# Patient Record
Sex: Female | Born: 1960
Health system: Southern US, Community
[De-identification: ages and names within clinical notes are randomized; demographics above are authoritative.]

## PROBLEM LIST (undated history)

## (undated) DIAGNOSIS — F419 Anxiety disorder, unspecified: Secondary | ICD-10-CM

## (undated) DIAGNOSIS — R1310 Dysphagia, unspecified: Secondary | ICD-10-CM

## (undated) DIAGNOSIS — K589 Irritable bowel syndrome without diarrhea: Secondary | ICD-10-CM

## (undated) DIAGNOSIS — Z973 Presence of spectacles and contact lenses: Secondary | ICD-10-CM

## (undated) DIAGNOSIS — E669 Obesity, unspecified: Secondary | ICD-10-CM

## (undated) DIAGNOSIS — R011 Cardiac murmur, unspecified: Secondary | ICD-10-CM

## (undated) DIAGNOSIS — B029 Zoster without complications: Secondary | ICD-10-CM

## (undated) DIAGNOSIS — J301 Allergic rhinitis due to pollen: Secondary | ICD-10-CM

## (undated) DIAGNOSIS — M109 Gout, unspecified: Secondary | ICD-10-CM

## (undated) DIAGNOSIS — M545 Low back pain, unspecified: Secondary | ICD-10-CM

## (undated) DIAGNOSIS — N898 Other specified noninflammatory disorders of vagina: Secondary | ICD-10-CM

## (undated) DIAGNOSIS — N950 Postmenopausal bleeding: Secondary | ICD-10-CM

## (undated) DIAGNOSIS — R519 Headache, unspecified: Secondary | ICD-10-CM

## (undated) DIAGNOSIS — I10 Essential (primary) hypertension: Secondary | ICD-10-CM

## (undated) DIAGNOSIS — Z8719 Personal history of other diseases of the digestive system: Secondary | ICD-10-CM

## (undated) DIAGNOSIS — K219 Gastro-esophageal reflux disease without esophagitis: Secondary | ICD-10-CM

## (undated) DIAGNOSIS — M549 Dorsalgia, unspecified: Secondary | ICD-10-CM

## (undated) DIAGNOSIS — R112 Nausea with vomiting, unspecified: Secondary | ICD-10-CM

## (undated) DIAGNOSIS — B351 Tinea unguium: Secondary | ICD-10-CM

## (undated) DIAGNOSIS — G8929 Other chronic pain: Secondary | ICD-10-CM

## (undated) DIAGNOSIS — M1990 Unspecified osteoarthritis, unspecified site: Secondary | ICD-10-CM

## (undated) DIAGNOSIS — R238 Other skin changes: Secondary | ICD-10-CM

## (undated) DIAGNOSIS — B379 Candidiasis, unspecified: Secondary | ICD-10-CM

## (undated) DIAGNOSIS — Z9889 Other specified postprocedural states: Secondary | ICD-10-CM

## (undated) DIAGNOSIS — J302 Other seasonal allergic rhinitis: Secondary | ICD-10-CM

## (undated) HISTORY — DX: Obesity, unspecified: E66.9

## (undated) HISTORY — DX: Low back pain, unspecified: M54.50

## (undated) HISTORY — DX: Other specified noninflammatory disorders of vagina: N89.8

## (undated) HISTORY — DX: Zoster without complications: B02.9

## (undated) HISTORY — PX: CHOLECYSTECTOMY: SHX55

## (undated) HISTORY — PX: OTHER SURGICAL HISTORY: SHX169

## (undated) HISTORY — DX: Allergic rhinitis due to pollen: J30.1

## (undated) HISTORY — DX: Essential (primary) hypertension: I10

## (undated) HISTORY — DX: Candidiasis, unspecified: B37.9

---
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
## 1999-02-12 ENCOUNTER — Other Ambulatory Visit: Admission: RE | Admit: 1999-02-12 | Discharge: 1999-02-12 | Payer: Self-pay | Admitting: Gynecology

## 2000-02-01 ENCOUNTER — Other Ambulatory Visit: Admission: RE | Admit: 2000-02-01 | Discharge: 2000-02-01 | Payer: Self-pay | Admitting: Gynecology

## 2001-05-30 ENCOUNTER — Other Ambulatory Visit: Admission: RE | Admit: 2001-05-30 | Discharge: 2001-05-30 | Payer: Self-pay | Admitting: Obstetrics and Gynecology

## 2002-07-16 ENCOUNTER — Other Ambulatory Visit: Admission: RE | Admit: 2002-07-16 | Discharge: 2002-07-16 | Payer: Self-pay | Admitting: Obstetrics and Gynecology

## 2003-07-30 ENCOUNTER — Ambulatory Visit (HOSPITAL_COMMUNITY): Admission: RE | Admit: 2003-07-30 | Discharge: 2003-07-30 | Payer: Self-pay | Admitting: Pulmonary Disease

## 2003-10-07 ENCOUNTER — Other Ambulatory Visit: Admission: RE | Admit: 2003-10-07 | Discharge: 2003-10-07 | Payer: Self-pay | Admitting: Obstetrics and Gynecology

## 2004-10-25 ENCOUNTER — Other Ambulatory Visit: Admission: RE | Admit: 2004-10-25 | Discharge: 2004-10-25 | Payer: Self-pay | Admitting: Obstetrics and Gynecology

## 2004-10-26 ENCOUNTER — Ambulatory Visit (HOSPITAL_COMMUNITY): Admission: RE | Admit: 2004-10-26 | Discharge: 2004-10-26 | Payer: Self-pay | Admitting: Obstetrics and Gynecology

## 2004-11-09 LAB — HM MAMMOGRAPHY

## 2005-08-17 ENCOUNTER — Encounter: Admission: RE | Admit: 2005-08-17 | Discharge: 2005-08-17 | Payer: Self-pay | Admitting: Orthopedic Surgery

## 2010-06-06 ENCOUNTER — Encounter: Payer: Self-pay | Admitting: Orthopedic Surgery

## 2011-04-04 ENCOUNTER — Other Ambulatory Visit (INDEPENDENT_AMBULATORY_CARE_PROVIDER_SITE_OTHER): Payer: Self-pay | Admitting: *Deleted

## 2011-04-04 DIAGNOSIS — Z1211 Encounter for screening for malignant neoplasm of colon: Secondary | ICD-10-CM

## 2011-04-29 ENCOUNTER — Ambulatory Visit: Admit: 2011-04-29 | Payer: Self-pay | Admitting: Internal Medicine

## 2011-04-29 SURGERY — COLONOSCOPY
Anesthesia: Moderate Sedation

## 2012-04-02 ENCOUNTER — Encounter (INDEPENDENT_AMBULATORY_CARE_PROVIDER_SITE_OTHER): Payer: Self-pay | Admitting: *Deleted

## 2012-04-03 ENCOUNTER — Other Ambulatory Visit (INDEPENDENT_AMBULATORY_CARE_PROVIDER_SITE_OTHER): Payer: Self-pay | Admitting: *Deleted

## 2012-04-03 DIAGNOSIS — Z1211 Encounter for screening for malignant neoplasm of colon: Secondary | ICD-10-CM

## 2012-04-03 DIAGNOSIS — R1310 Dysphagia, unspecified: Secondary | ICD-10-CM

## 2012-04-04 ENCOUNTER — Telehealth (INDEPENDENT_AMBULATORY_CARE_PROVIDER_SITE_OTHER): Payer: Self-pay | Admitting: *Deleted

## 2012-04-04 NOTE — Telephone Encounter (Signed)
  Procedure: tcs/egd/ed  Reason/Indication:  Screening, dysphagia  Has patient had this procedure before?  no  If so, when, by whom and where?    Is there a family history of colon cancer?  no  Who?  What age when diagnosed?    Is patient diabetic?   no      Does patient have prosthetic heart valve?  no  Do you have a pacemaker?  no  Has patient had joint replacement within last 12 months?  no  Is patient on Coumadin, Plavix and/or Aspirin? no  Medications: micardis/hct 80/25 mg daily, vit b complex w/ c daily, vit e 400 mg daily, vit d 1000 iu daily, hydrocodone 5/500 prn, cyclobenzaprine 5 mg prn, dexilant 60 mg prn  Allergies: pcn  Medication Adjustment:   Procedure date & time: 05/04/12 at 730  tcs

## 2012-04-04 NOTE — Telephone Encounter (Signed)
agree

## 2012-04-25 ENCOUNTER — Encounter (HOSPITAL_COMMUNITY): Payer: Self-pay | Admitting: Pharmacy Technician

## 2012-05-04 ENCOUNTER — Ambulatory Visit (HOSPITAL_COMMUNITY)
Admission: RE | Admit: 2012-05-04 | Discharge: 2012-05-04 | Disposition: A | Discharge status: Home / Self Care | Payer: 59 | Source: Ambulatory Visit | Attending: Internal Medicine | Admitting: Internal Medicine

## 2012-05-04 ENCOUNTER — Encounter (HOSPITAL_COMMUNITY): Payer: Self-pay | Admitting: *Deleted

## 2012-05-04 ENCOUNTER — Encounter (HOSPITAL_COMMUNITY)
Admission: RE | Disposition: A | Discharge status: Home / Self Care | Payer: Self-pay | Source: Ambulatory Visit | Attending: Internal Medicine

## 2012-05-04 DIAGNOSIS — D126 Benign neoplasm of colon, unspecified: Secondary | ICD-10-CM | POA: Insufficient documentation

## 2012-05-04 DIAGNOSIS — K319 Disease of stomach and duodenum, unspecified: Secondary | ICD-10-CM

## 2012-05-04 DIAGNOSIS — Z1211 Encounter for screening for malignant neoplasm of colon: Secondary | ICD-10-CM

## 2012-05-04 DIAGNOSIS — K5730 Diverticulosis of large intestine without perforation or abscess without bleeding: Secondary | ICD-10-CM | POA: Insufficient documentation

## 2012-05-04 DIAGNOSIS — I10 Essential (primary) hypertension: Secondary | ICD-10-CM | POA: Insufficient documentation

## 2012-05-04 DIAGNOSIS — K208 Other esophagitis without bleeding: Secondary | ICD-10-CM | POA: Insufficient documentation

## 2012-05-04 DIAGNOSIS — K2960 Other gastritis without bleeding: Secondary | ICD-10-CM | POA: Insufficient documentation

## 2012-05-04 DIAGNOSIS — K222 Esophageal obstruction: Secondary | ICD-10-CM

## 2012-05-04 DIAGNOSIS — K219 Gastro-esophageal reflux disease without esophagitis: Secondary | ICD-10-CM | POA: Insufficient documentation

## 2012-05-04 DIAGNOSIS — K2970 Gastritis, unspecified, without bleeding: Secondary | ICD-10-CM

## 2012-05-04 DIAGNOSIS — R1310 Dysphagia, unspecified: Secondary | ICD-10-CM

## 2012-05-04 HISTORY — DX: Nausea with vomiting, unspecified: R11.2

## 2012-05-04 HISTORY — DX: Gastro-esophageal reflux disease without esophagitis: K21.9

## 2012-05-04 HISTORY — PX: SAVORY DILATION: SHX5439

## 2012-05-04 HISTORY — DX: Other chronic pain: G89.29

## 2012-05-04 HISTORY — DX: Essential (primary) hypertension: I10

## 2012-05-04 HISTORY — DX: Other seasonal allergic rhinitis: J30.2

## 2012-05-04 HISTORY — DX: Dorsalgia, unspecified: M54.9

## 2012-05-04 HISTORY — DX: Other specified postprocedural states: Z98.890

## 2012-05-04 HISTORY — PX: COLONOSCOPY WITH ESOPHAGOGASTRODUODENOSCOPY (EGD): SHX5779

## 2012-05-04 HISTORY — DX: Dysphagia, unspecified: R13.10

## 2012-05-04 HISTORY — PX: MALONEY DILATION: SHX5535

## 2012-05-04 HISTORY — PX: BALLOON DILATION: SHX5330

## 2012-05-04 SURGERY — COLONOSCOPY WITH ESOPHAGOGASTRODUODENOSCOPY (EGD)
Anesthesia: Moderate Sedation

## 2012-05-04 MED ORDER — DEXLANSOPRAZOLE 60 MG PO CPDR: 60.0000 mg | DELAYED_RELEASE_CAPSULE | Freq: Every day | ORAL | Status: DC

## 2012-05-04 MED ORDER — STERILE WATER FOR IRRIGATION IR SOLN
Status: DC | PRN
  Administered 2012-05-04: 08:00:00

## 2012-05-04 MED ORDER — MEPERIDINE HCL 50 MG/ML IJ SOLN
INTRAMUSCULAR | Status: DC | PRN
  Administered 2012-05-04 (×4): 25 mg via INTRAVENOUS

## 2012-05-04 MED ORDER — MEPERIDINE HCL 50 MG/ML IJ SOLN
INTRAMUSCULAR | Status: AC
  Filled 2012-05-04: qty 1

## 2012-05-04 MED ORDER — SODIUM CHLORIDE 0.45 % IV SOLN
INTRAVENOUS | Status: DC
  Administered 2012-05-04: 08:00:00 via INTRAVENOUS

## 2012-05-04 MED ORDER — MIDAZOLAM HCL 5 MG/5ML IJ SOLN
INTRAMUSCULAR | Status: DC | PRN
  Administered 2012-05-04: 3 mg via INTRAVENOUS
  Administered 2012-05-04 (×2): 2 mg via INTRAVENOUS
  Administered 2012-05-04: 3 mg via INTRAVENOUS
  Administered 2012-05-04 (×2): 2 mg via INTRAVENOUS
  Administered 2012-05-04: 1 mg via INTRAVENOUS

## 2012-05-04 MED ORDER — MIDAZOLAM HCL 5 MG/5ML IJ SOLN
INTRAMUSCULAR | Status: AC
  Filled 2012-05-04: qty 10

## 2012-05-04 MED ORDER — MIDAZOLAM HCL 5 MG/5ML IJ SOLN
INTRAMUSCULAR | Status: AC
  Filled 2012-05-04: qty 5

## 2012-05-04 NOTE — Op Note (Signed)
EGD PROCEDURE REPORT  PATIENT:  Renee Alvarez  MR#:  [id number] Birthdate:  [birth year], 51 y.o., female Endoscopist:  Dr. Malissa Hippo, MD Referred By:  Dr. Oneal Deputy. Juanetta Gosling, MD Procedure Date: 05/04/2012  Procedure:   EGD, ED & Colonoscopy.  Indications:  Patient is 51 year old Caucasian female who presents with intermittent solid food dysphagia. She also has GERD and takes PPI on when necessary basis. She is also undergoing average risk screening colonoscopy.            Informed Consent:  The risks, benefits, alternatives & imponderables which include, but are not limited to, bleeding, infection, perforation, drug reaction and potential missed lesion have been reviewed.  The potential for biopsy, lesion removal, esophageal dilation, etc. have also been discussed.  Questions have been answered.  All parties agreeable.  Please see history & physical in medical record for more information.  Medications:  Demerol 100 mg IV Versed 15 mg IV Cetacaine spray topically for oropharyngeal anesthesia  EGD  Description of procedure:  The endoscope was introduced through the mouth and advanced to the second portion of the duodenum without difficulty or limitations. The mucosal surfaces were surveyed very carefully during advancement of the scope and upon withdrawal.  Findings:  Esophagus:  Mucosa of the proximal and middle and middle third was normal. Few erosions noted within the distal 3 cm along with scar and erosion at GE junction along with noncritical ring. GEJ:  36 cm Hiatus:  38 cm Stomach:  Stomach was empty and distended very well with insufflation. Folds in the proximal stomach were normal. Examination mucosa at body revealed  few hyperplastic-appearing polyps. These were left alone. Scattered erosions noted at antrum along with 6-7 mm submucosal lesion suspicious for small leiomyoma. Pyloric channel was patent. Angularis fundus and cardia were examined by retroflexing the scope and were  normal. Duodenum:  Normal bulbar and post bulbar mucosa.  Therapeutic/Diagnostic Maneuvers Performed:  Esophagus dilated by passing 54 French Maloney dilator resulting and linear mucosal disruption just below the UES and distally at GE junction.  COLONOSCOPY Description of procedure:  After a digital rectal exam was performed, that colonoscope was advanced from the anus through the rectum and colon to the area of the cecum, ileocecal valve and appendiceal orifice. The cecum was deeply intubated. These structures were well-seen and photographed for the record. From the level of the cecum and ileocecal valve, the scope was slowly and cautiously withdrawn. The mucosal surfaces were carefully surveyed utilizing scope tip to flexion to facilitate fold flattening as needed. The scope was pulled down into the rectum where a thorough exam including retroflexion was performed.  Findings:   Prep excellent. Few small diverticula at sigmoid colon. Small polyp ablated via cold biopsy from mid sigmoid colon. Normal rectal mucosa and anorectal junction.  Therapeutic/Diagnostic Maneuvers Performed:  see above  Complications:  None  Cecal Withdrawal Time:  12 minutes  Impression:  Erosive esophagitis with the ring at GE junction. Few small hyperplastic polyps at gastric body which are left alone. Erosive antral gastritis. 6 to 7 mm submucosal lesion at antrum suspicious for small leiomyoma. 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  Colonoscopy completed to cecum. Few small diverticula at sigmoid colon. Small polyp ablated via cold biopsy from the sigmoid colon.   Recommendations:  Continue anti-reflux measures. Take Dexilant  60 mg by mouth every  morning. H. pylori serology. I will contact patient with results of biopsy and blood test results.  Johnsie Moscoso U   05/04/2012 8:45 AM  CC: Dr. Fredirick Maudlin, MD & Dr. Bonnetta Barry ref. provider found

## 2012-05-04 NOTE — H&P (Addendum)
Renee Alvarez is an 51 y.o. female.   Chief Complaint: Patient is here for EGD, ED and colonoscopy. HPI: Patient is 51 year old Caucasian female who presents with several month history of intermittent solid food dysphagia. She also has intermittent heartburn and uses PPI on command. She points to her suprasternal area side abortus obstruction. She has a good appetite denies weight loss. She is also undergoing  screening colonoscopy. There is no history of rectal bleeding or change in her bowel habits. Family history is negative for colorectal carcinoma.  Past Medical History  Diagnosis Date  . GERD (gastroesophageal reflux disease)   . Dysphagia   . Chronic back pain   . Seasonal allergies   . Hypertension   . PONV (postoperative nausea and vomiting)     Past Surgical History  Procedure Date  . Cesarean section 1997  . Expoloratory lap   . Cyst removed from right finger   . Cholecystectomy     Family History  Problem Relation Age of Onset  . Colon cancer Neg Hx    Social History:  reports that she has never smoked. She does not have any smokeless tobacco history on file. She reports that she does not drink alcohol or use illicit drugs.  Allergies:  Allergies  Allergen Reactions  . Penicillins Rash    Medications Prior to Admission  Medication Sig Dispense Refill  . B Complex-C (B-COMPLEX WITH VITAMIN C) tablet Take 1 tablet by mouth daily.      . cholecalciferol (VITAMIN D) 1000 UNITS tablet Take 1,000 Units by mouth daily.      . cyclobenzaprine (FLEXERIL) 5 MG tablet Take 5 mg by mouth 3 (three) times daily as needed. Back spasms.      [name] Kitchen dexlansoprazole (DEXILANT) 60 MG capsule Take 60 mg by mouth daily as needed. Heartburn/indigestion.      [name] Kitchen HYDROcodone-acetaminophen (VICODIN) 5-500 MG per tablet Take 1 tablet by mouth every 6 (six) hours as needed. Back pain.      [name] Kitchen telmisartan-hydrochlorothiazide (MICARDIS HCT) 80-25 MG per tablet Take 1 tablet by mouth daily.      .  vitamin E 400 UNIT capsule Take 400 Units by mouth daily.      [name] Kitchen levocetirizine (XYZAL) 5 MG tablet Take 5 mg by mouth daily as needed. Allergies.        No results found for this or any previous visit (from the past 48 hour(s)). No results found.  ROS  Blood pressure 134/85, pulse 100, temperature 97.9 F (36.6 C), temperature source Oral, resp. rate 24, height 5\' 8"  (1.727 m), weight 230 lb (104.327 kg), SpO2 99.00%. Physical Exam  Constitutional: She appears well-developed and well-nourished.  HENT:  Mouth/Throat: Oropharynx is clear and moist.  Eyes: Conjunctivae normal are normal. No scleral icterus.  Neck: No thyromegaly present.  Cardiovascular: Normal rate, regular rhythm and normal heart sounds.   Murmur: short systolic murmur at LLSB. Respiratory: Effort normal and breath sounds normal.  GI: Soft. She exhibits no distension and no mass. There is no tenderness.  Musculoskeletal: She exhibits no edema.  Lymphadenopathy:    She has no cervical adenopathy.  Neurological: She is alert.  Skin: Skin is warm and dry.     Assessment/Plan Solid food dysphagia. GERD. EGD with ED and average risk screening colonoscopy.  REHMAN,NAJEEB U 05/04/2012, 7:39 AM

## 2012-05-07 LAB — H. PYLORI ANTIBODY, IGG: H Pylori IgG: <0.4 {ISR}

## 2012-05-10 ENCOUNTER — Encounter (INDEPENDENT_AMBULATORY_CARE_PROVIDER_SITE_OTHER): Payer: Self-pay | Admitting: *Deleted

## 2012-05-11 ENCOUNTER — Encounter (HOSPITAL_COMMUNITY): Payer: Self-pay | Admitting: Internal Medicine

## 2012-05-22 ENCOUNTER — Other Ambulatory Visit (INDEPENDENT_AMBULATORY_CARE_PROVIDER_SITE_OTHER): Payer: Self-pay | Admitting: Internal Medicine

## 2012-05-22 DIAGNOSIS — K219 Gastro-esophageal reflux disease without esophagitis: Secondary | ICD-10-CM

## 2012-05-22 MED ORDER — PANTOPRAZOLE SODIUM 40 MG PO TBEC: 40.0000 mg | DELAYED_RELEASE_TABLET | Freq: Every day | ORAL | Status: DC

## 2012-05-25 ENCOUNTER — Other Ambulatory Visit: Payer: Self-pay | Admitting: Obstetrics and Gynecology

## 2012-06-30 ENCOUNTER — Other Ambulatory Visit: Payer: Self-pay

## 2012-07-03 ENCOUNTER — Telehealth (INDEPENDENT_AMBULATORY_CARE_PROVIDER_SITE_OTHER): Payer: Self-pay | Admitting: Internal Medicine

## 2012-07-03 DIAGNOSIS — K219 Gastro-esophageal reflux disease without esophagitis: Secondary | ICD-10-CM

## 2012-07-03 MED ORDER — SUCRALFATE 1 GM/10ML PO SUSP: 1.0000 g | Freq: Four times a day (QID) | ORAL | Status: DC

## 2012-07-03 NOTE — Telephone Encounter (Signed)
Rx to [facility]

## 2013-01-05 ENCOUNTER — Emergency Department (HOSPITAL_COMMUNITY)
Admission: EM | Admit: 2013-01-05 | Discharge: 2013-01-05 | Disposition: A | Discharge status: Home / Self Care | Payer: 59 | Source: Home / Self Care

## 2013-01-05 ENCOUNTER — Encounter (HOSPITAL_COMMUNITY): Payer: Self-pay | Admitting: Emergency Medicine

## 2013-01-05 ENCOUNTER — Emergency Department (INDEPENDENT_AMBULATORY_CARE_PROVIDER_SITE_OTHER): Payer: 59

## 2013-01-05 DIAGNOSIS — M24873 Other specific joint derangements of unspecified ankle, not elsewhere classified: Secondary | ICD-10-CM

## 2013-01-05 DIAGNOSIS — M25372 Other instability, left ankle: Secondary | ICD-10-CM

## 2013-01-05 MED ORDER — MELOXICAM 15 MG PO TABS: 15.0000 mg | ORAL_TABLET | Freq: Every day | ORAL | Status: DC | PRN

## 2013-01-05 NOTE — ED Provider Notes (Signed)
Oliviarose AUBRII SHARPLESS is a 52 y.o. female who presents to Urgent Care today for left ankle swelling and mild pain. Started about one week ago after increased activity. Patient denies any injury. The pain and swelling are worse with activity and better with rest. She denies any significant pain at rest. She's tried some over-the-counter pain medications the only worked a bit. She feels well otherwise. No radiating pain weakness numbness.   PMH reviewed. Hypertension, and obesity History  Substance Use Topics  . Smoking status: Never Smoker   . Smokeless tobacco: Not on file  . Alcohol Use: No   ROS as above Medications reviewed. No current facility-administered medications for this encounter.   Current Outpatient Prescriptions  Medication Sig Dispense Refill  . B Complex-C (B-COMPLEX WITH VITAMIN C) tablet Take 1 tablet by mouth daily.      . cholecalciferol (VITAMIN D) 1000 UNITS tablet Take 1,000 Units by mouth daily.      [name] Kitchen telmisartan-hydrochlorothiazide (MICARDIS HCT) 80-25 MG per tablet Take 1 tablet by mouth daily.      . cyclobenzaprine (FLEXERIL) 5 MG tablet Take 5 mg by mouth 3 (three) times daily as needed. Back spasms.      [name] Kitchen HYDROcodone-acetaminophen (VICODIN) 5-500 MG per tablet Take 1 tablet by mouth every 6 (six) hours as needed. Back pain.      [name] Kitchen levocetirizine (XYZAL) 5 MG tablet Take 5 mg by mouth daily as needed. Allergies.      . meloxicam (MOBIC) 15 MG tablet Take 1 tablet (15 mg total) by mouth daily as needed for pain.  30 tablet  0  . pantoprazole (PROTONIX) 40 MG tablet Take 1 tablet (40 mg total) by mouth daily.  90 tablet  3  . sucralfate (CARAFATE) 1 GM/10ML suspension Take 10 mLs (1 g total) by mouth 4 (four) times daily.  420 mL  1  . vitamin E 400 UNIT capsule Take 400 Units by mouth daily.        Exam:  BP 133/76  Pulse 74  Temp(Src) 97.5 F (36.4 C) (Oral)  Resp 18  SpO2 99% Gen: Well NAD LEFT ANKLE: Mild effusion present. Otherwise normal-appearing.   Nontender, normal range of motion. Positive talar tilt and anterior drawer Capillary refill sensation and motion are intact  No results found for this or any previous visit (from the past 24 hour(s)). Dg Ankle Complete Left  01/05/2013   *RADIOLOGY REPORT*  Clinical Data: Ankle swelling.  No injury.  LEFT ANKLE COMPLETE - 3+ VIEW  Comparison: None.  Findings: No fracture or bone lesion.  The ankle mortise is normally spaced and aligned.  There is a small plantar calcaneal spur.  Diffuse soft tissue swelling is noted most evident medially.  IMPRESSION: No fracture or ankle joint abnormality.  Soft tissue swelling. Small plantar calcaneal spur.   Original Report Authenticated By: Amie Portland, M.D.   Limited musculoskeletal ultrasound of the left ankle.  Peroneal tendons are intact and in the groove and do. Excessive motion with ankle mobility.  The ATF ligament is visualized with increased laxity with inversion.  No effusion.   Assessment and Plan: 52 y.o. female with ankle instability with effusion.  Followup with Dr. Farris Has at [facility] for further evaluation and management.  Meloxicam and ASO ankle brace and home exercise program.  Discussed warning signs or symptoms. Please see discharge instructions. Patient expresses understanding.      Rodolph Bong, MD 01/05/13 1726

## 2013-01-05 NOTE — ED Notes (Signed)
Pt c/o left ankle pain/swelling onset 1 week Pain increases w/activity; swelling/pain goes down when she rests Denies: inj/trauma Alert w/no signs of acute distress.

## 2013-03-21 ENCOUNTER — Other Ambulatory Visit: Payer: Self-pay

## 2013-09-23 ENCOUNTER — Other Ambulatory Visit (INDEPENDENT_AMBULATORY_CARE_PROVIDER_SITE_OTHER): Payer: Self-pay | Admitting: Internal Medicine

## 2013-09-23 DIAGNOSIS — J329 Chronic sinusitis, unspecified: Secondary | ICD-10-CM

## 2013-09-23 NOTE — Telephone Encounter (Signed)
Rx sent 

## 2013-10-29 ENCOUNTER — Telehealth (INDEPENDENT_AMBULATORY_CARE_PROVIDER_SITE_OTHER): Payer: Self-pay | Admitting: Internal Medicine

## 2013-10-29 DIAGNOSIS — J329 Chronic sinusitis, unspecified: Secondary | ICD-10-CM

## 2013-10-29 MED ORDER — AZITHROMYCIN 250 MG PO TABS: ORAL_TABLET | ORAL | Status: DC

## 2013-10-29 NOTE — Telephone Encounter (Signed)
z PAK REORDERED.

## 2014-06-05 ENCOUNTER — Encounter: Payer: Self-pay | Admitting: Adult Health

## 2014-06-05 ENCOUNTER — Ambulatory Visit (INDEPENDENT_AMBULATORY_CARE_PROVIDER_SITE_OTHER): Payer: 59 | Admitting: Adult Health

## 2014-06-05 VITALS — BP 138/80 | Ht 67.0 in | Wt 265.0 lb

## 2014-06-05 DIAGNOSIS — N898 Other specified noninflammatory disorders of vagina: Secondary | ICD-10-CM

## 2014-06-05 DIAGNOSIS — B379 Candidiasis, unspecified: Secondary | ICD-10-CM

## 2014-06-05 MED ORDER — NYSTATIN-TRIAMCINOLONE 100000-0.1 UNIT/GM-% EX OINT: 1.0000 "application " | TOPICAL_OINTMENT | Freq: Two times a day (BID) | CUTANEOUS | Status: DC

## 2014-06-05 MED ORDER — FLUCONAZOLE 150 MG PO TABS: ORAL_TABLET | ORAL | Status: DC

## 2014-06-05 NOTE — Progress Notes (Signed)
Subjective:     Patient ID: Renee Alvarez, female   DOB: September 07, 1960, 54 y.o.   MRN: [id number]  HPI Renee Alvarez is a 54 year old white female in complaining of vaginal irritation and itching. used monistat Friday and then felt something in vagina and saw blood Saturday.  Review of Systems See HPI Reviewed past medical,surgical, social and family history. Reviewed medications and allergies.      Objective:   Physical Exam BP 138/80 mmHg  Ht 5\' 7"  (1.702 m)  Wt 265 lb (120.203 kg)  BMI 41.50 kg/m2   Skin warm and dry.Pelvic: external genitalia is red and swollen, no lesions vagina: white discharge without odor, cervix:smooth and bulbous, uterus: normal size, shape and contour, non tender, no masses felt, adnexa: no masses or tenderness noted. Wet prep: + for yeast and +WBCs.  Assessment:     Vaginal irritation Yeast     Plan:     Rx diflucan 150 mg #2 take 1 now and 1 in 3 days with 1 refill Rx mytrex cream use bid prn with 1 refill Use luvena for vaginal moisture and astroglide for sex Review handout on yeast

## 2014-06-05 NOTE — Patient Instructions (Signed)
Monilial Vaginitis Vaginitis in a soreness, swelling and redness (inflammation) of the vagina and vulva. Monilial vaginitis is not a sexually transmitted infection. CAUSES  Yeast vaginitis is caused by yeast (candida) that is normally found in your vagina. With a yeast infection, the candida has overgrown in number to a point that upsets the chemical balance. SYMPTOMS   White, thick vaginal discharge.  Swelling, itching, redness and irritation of the vagina and possibly the lips of the vagina (vulva).  Burning or painful urination.  Painful intercourse. DIAGNOSIS  Things that may contribute to monilial vaginitis are:  Postmenopausal and virginal states.  Pregnancy.  Infections.  Being tired, sick or stressed, especially if you had monilial vaginitis in the past.  Diabetes. Good control will help lower the chance.  Birth control pills.  Tight fitting garments.  Using bubble bath, feminine sprays, douches or deodorant tampons.  Taking certain medications that kill germs (antibiotics).  Sporadic recurrence can occur if you become ill. TREATMENT  Your caregiver will give you medication.  There are several kinds of anti monilial vaginal creams and suppositories specific for monilial vaginitis. For recurrent yeast infections, use a suppository or cream in the vagina 2 times a week, or as directed.  Anti-monilial or steroid cream for the itching or irritation of the vulva may also be used. Get your caregiver's permission.  Painting the vagina with methylene blue solution may help if the monilial cream does not work.  Eating yogurt may help prevent monilial vaginitis. HOME CARE INSTRUCTIONS   Finish all medication as prescribed.  Do not have sex until treatment is completed or after your caregiver tells you it is okay.  Take warm sitz baths.  Do not douche.  Do not use tampons, especially scented ones.  Wear cotton underwear.  Avoid tight pants and panty  hose.  Tell your sexual partner that you have a yeast infection. They should go to their caregiver if they have symptoms such as mild rash or itching.  Your sexual partner should be treated as well if your infection is difficult to eliminate.  Practice safer sex. Use condoms.  Some vaginal medications cause latex condoms to fail. Vaginal medications that harm condoms are:  Cleocin cream.  Butoconazole (Femstat).  Terconazole (Terazol) vaginal suppository.  Miconazole (Monistat) (may be purchased over the counter). SEEK MEDICAL CARE IF:   You have a temperature by mouth above 102 F (38.9 C).  The infection is getting worse after 2 days of treatment.  The infection is not getting better after 3 days of treatment.  You develop blisters in or around your vagina.  You develop vaginal bleeding, and it is not your menstrual period.  You have pain when you urinate.  You develop intestinal problems.  You have pain with sexual intercourse. Document Released: 02/09/2005 Document Revised: 07/25/2011 Document Reviewed: 10/24/2008 [facility] Patient Information 2015 [place], [place]. This information is not intended to replace advice given to you by your health care provider. Make sure you discuss any questions you have with your health care provider. Try luvena for vaginal moisture astroglide with sex

## 2014-08-19 ENCOUNTER — Telehealth (INDEPENDENT_AMBULATORY_CARE_PROVIDER_SITE_OTHER): Payer: Self-pay | Admitting: Internal Medicine

## 2014-08-19 NOTE — Telephone Encounter (Signed)
error 

## 2015-01-21 ENCOUNTER — Other Ambulatory Visit (INDEPENDENT_AMBULATORY_CARE_PROVIDER_SITE_OTHER): Payer: Self-pay | Admitting: Internal Medicine

## 2015-01-21 DIAGNOSIS — J0120 Acute ethmoidal sinusitis, unspecified: Secondary | ICD-10-CM

## 2015-01-21 MED ORDER — LEVOFLOXACIN 500 MG PO TABS: 500.0000 mg | ORAL_TABLET | Freq: Every day | ORAL | Status: DC

## 2015-02-13 ENCOUNTER — Ambulatory Visit (HOSPITAL_COMMUNITY): Payer: 59 | Attending: Sports Medicine

## 2015-02-13 DIAGNOSIS — M4000 Postural kyphosis, site unspecified: Secondary | ICD-10-CM | POA: Insufficient documentation

## 2015-02-13 DIAGNOSIS — M549 Dorsalgia, unspecified: Secondary | ICD-10-CM | POA: Insufficient documentation

## 2015-02-13 DIAGNOSIS — M25462 Effusion, left knee: Secondary | ICD-10-CM | POA: Diagnosis present

## 2015-02-13 DIAGNOSIS — M545 Low back pain, unspecified: Secondary | ICD-10-CM

## 2015-02-13 DIAGNOSIS — M6281 Muscle weakness (generalized): Secondary | ICD-10-CM | POA: Insufficient documentation

## 2015-02-13 DIAGNOSIS — R293 Abnormal posture: Secondary | ICD-10-CM | POA: Diagnosis present

## 2015-02-13 DIAGNOSIS — M25562 Pain in left knee: Secondary | ICD-10-CM | POA: Insufficient documentation

## 2015-02-13 DIAGNOSIS — R2681 Unsteadiness on feet: Secondary | ICD-10-CM | POA: Insufficient documentation

## 2015-02-13 NOTE — Patient Instructions (Signed)
Continue with seated LAQ as directed by orthopedist.  -Ice anterior knee 10-15 minute bouts to aid with management of swelling.

## 2015-02-13 NOTE — Therapy (Signed)
[place], [place], [address] Phone: [phone]   Fax:  [phone]  Physical Therapy Evaluation  Patient Details  Name: Renee Alvarez MRN: [id number] Date of Birth: [birth year] Referring Provider:  Verner Chol, MD  Encounter Date: 02/13/2015      PT End of Session - 02/13/15 1506    Visit Number 1   Number of Visits 18   Date for PT Re-Evaluation 04/10/15   Authorization Type [employer]   Authorization Time Period 02/13/15-04/10/15   Authorization - Visit Number 1   PT Start Time 1443   PT Stop Time 1540   PT Time Calculation (min) 60 min   Activity Tolerance Patient tolerated treatment well;No increased pain   Behavior During Therapy [facility] for tasks assessed/performed      Past Medical History  Diagnosis Date  . GERD (gastroesophageal reflux disease)   . Dysphagia   . Chronic back pain   . Seasonal allergies   . Hypertension   . PONV (postoperative nausea and vomiting)   . Vaginal irritation 06/05/2014  . Yeast infection 06/05/2014    Past Surgical History  Procedure Laterality Date  . Cesarean section  1997  . Expoloratory lap    . Cyst removed from right finger    . Cholecystectomy    . Colonoscopy with esophagogastroduodenoscopy (egd)  05/04/2012    Procedure: COLONOSCOPY WITH ESOPHAGOGASTRODUODENOSCOPY (EGD);  Surgeon: Rogene Houston, MD;  [facility];  Service: Endoscopy;  Laterality: N/A;  730  . Balloon dilation  05/04/2012    Procedure: BALLOON DILATION;  Surgeon: Rogene Houston, MD;  [facility];  Service: Endoscopy;  Laterality: N/A;  Venia Minks dilation  05/04/2012    Procedure: Venia Minks DILATION;  Surgeon: Rogene Houston, MD;  [facility];  Service: Endoscopy;  Laterality: N/A;  . Savory dilation  05/04/2012    Procedure: SAVORY DILATION;  Surgeon: Rogene Houston, MD;  [facility];  Service: Endoscopy;  Laterality: N/A;    There were  no vitals filed for this visit.  Visit Diagnosis:  Bilateral low back pain without sciatica - Plan: PT plan of care cert/re-cert  Knee swelling, left - Plan: PT plan of care cert/re-cert  Pain in knee joint, left - Plan: PT plan of care cert/re-cert  Unsteadiness on feet - Plan: PT plan of care cert/re-cert  Posture imbalance - Plan: PT plan of care cert/re-cert  Truncal muscle weakness - Plan: PT plan of care cert/re-cert  Dorsal back pain - Plan: PT plan of care cert/re-cert  Kyphosis (acquired) (postural) - Plan: PT plan of care cert/re-cert      Subjective Assessment - 02/13/15 1443    Subjective Pt reports chronic history of intermittent sharp shooting pain in R knee as well as chronic history of LBP and middle back pain. Pt recently had imaging wfor this issue and was refered to PT for prevention of further complications from arthritis in back and knee. Pt subsequently was found to have shingles in L4 distribution on L  which accounts for burning pain.     Pertinent History Shingles three weeks ago in Left L4 L accounting for bruning knee pain, which remains. Pain has finished all treatments for shingles and is improving. Pt also given injections in L knee as they pertain to inflammation and baker's cyst. Pt is unsure what was in theinjection but reports it has improved pain.    How long  can you sit comfortably? Limited by back pain    How long can you stand comfortably? Limited by back pain    How long can you walk comfortably? Limited by back pain    Diagnostic tests imaging of knee and low back.   Currently in Pain? Yes   Pain Location Knee   Pain Orientation Left            [facility] PT Assessment - 02/13/15 0001    Assessment   Medical Diagnosis L knee pain   Onset Date/Surgical Date 01/21/15   Next MD Visit Dr. Layne Benton; 10/7   Balance Screen   Has the patient fallen in the past 6 months No   Has the patient had a decrease in activity level because of a fear of  falling?  No   Is the patient reluctant to leave their home because of a fear of falling?  No   Prior Function   Level of Independence Independent   Vocation Full time employment   Vocation Requirements 90% walking/standing   Cognition   Overall Cognitive Status Within Functional Limits for tasks assessed   Observation/Other Assessments   Focus on Therapeutic Outcomes (FOTO)  37% limited   Observation/Other Assessments-Edema    Edema --  localized and firm swelling at tibial tuberosity.    Sensation   Light Touch Appears Intact   Step Down   Comments poor eccentric control of LLE from 8" step  absent any obvious hip control problems   PROM   Right Hip External Rotation  65   Right Hip Internal Rotation  10   Left Hip Extension 25   Left Hip Flexion 110   Left Hip External Rotation  65   Left Hip Internal Rotation  40   Lumbar Flexion 0 degrees   Lumbar Extension 12 degrees   Right Hip   Right Hip Extension 10   Right Hip Flexion 105   Lumbar    Lumbar - Left Rotation WNL   Thoracic   Thoracic Flexion excessive kyphosis between T4-T9   Thoracic Extension -5 dgerees   Palpation   Spinal mobility Flexion limited lumbar spine, with severe thoracic kyphosis in mid thoracic spine   Special Tests    Special Tests Knee Special Tests   Q-Angle (Patellofemoral Angle) Right;Left   Meniscus Tests Apley's Distraction;Apley's Compression   other    Findings Negative   Side  Left   Comments Anterior/ posterior drawer tests   Q-Angle (Patellofemoral Angle)- Right   Angle in degrees-Right  6   Q-Angle (Patellofemoral Angle)- Left   Angle in degrees-Left 14   Apley's Compression   Findings Negative   Apley's Distraction   Findings Negative   Static Standing Balance   Static Standing - Balance Support No upper extremity supported  Single Leg Stance   Static Standing - Comment/# of Minutes L: 17s; R: 7s                            PT Education - 02/13/15 1503     Education provided Yes   Education Details Explained the pros, cons, value, and limitations of diagnostic imaging and encouraged patient to remain open minded and not allow arbitraty findings related to imaging become stressful or concerning.    Person(s) Educated Patient   Methods Explanation   Comprehension Verbalized understanding          PT Short Term Goals - 02/13/15 1601  PT SHORT TERM GOAL #1   Title Pt will demonstrate independence in beginning home exercise program by twos weeks after commencement of therapy, to affirm self-efficacy in work at home to making progress toward goals.   PT SHORT TERM GOAL #2   Title After 2 weeks, pt will describe in detail 3 ways to manage exacerbation of symptoms at home to demonstrate greater self-efficacy in self-management of wellness and function.    PT SHORT TERM GOAL #3   Title Pt will improve six-minute-walk-test distance time by 35% after four weeks of therapy to demonstrate improved activity tolerance to community distance ambulation free from exacerbation of symptoms.            PT Long Term Goals - 03-03-15 1608    PT LONG TERM GOAL #1   Title Pt will demonstrate independence in advanced home exercise program by 1 week prior to discharge, to further self-efficacy in continuation of progress toward goals after discharge from therapy.    PT LONG TERM GOAL #2   Title After 8 weeks, pt will improve six-minute-walk-test distance to within 85-90% of age matched normative values to demonstrate improved activity tolerance to limited community distance ambulation and indep in IADL.    PT LONG TERM GOAL #3   Title Pt will demonstrate adequate motor control equal bilaterally in knee and hips during eccentric closed kinetic chain activity.                Plan - March 03, 2015 1508    Clinical Impression Statement Pt is a 54yo white female who is presenting after acute onset of L sided burning hip and knee pain, and a chronic history of  intermittent sharp shooting knee pain with abrupt movements or cutting. Pt has history or lumbar spine OA, which is well supported by recent imaging, as well as imaging that supports some altered patella tracking. Strength and sensation are unremarkable , grossly WNL, with some mild mobility restriction on the R hip. Pt shows sever mobility restritions and postural changes in lumbar and thoracic spine. Special testing assocaited with pathology of meniscus, ACL, or knee OA are negative. L knee demonstrating compartmental swelling at anterior joint line and likely baker's cyst posteriorly which has also been recently confirmed with imaging. Pt demonstrating poor eccentric motor control on L knee with step down, and genu recurvatum bilaterally, worse on R. Pt core strength is moderately limited y weakness and mobility restirctions creating limited control of knees and hips during dynamic mobility activity. Pt will benefit from skille dintervention to address the above impairment and to improve pain, swelling, and mobility limitations in LLE.    Pt will benefit from skilled therapeutic intervention in order to improve on the following deficits Abnormal gait;Decreased endurance;Decreased balance;Decreased mobility;Decreased strength;Increased edema;Difficulty walking;Impaired flexibility;Pain;Obesity   Rehab Potential Good   PT Frequency 2x / week   PT Duration 8 weeks   PT Treatment/Interventions ADLs/Self Care Home Management;Therapeutic exercise;Therapeutic activities;Functional mobility training;Gait training;Stair training;Balance training;Patient/family education   PT Next Visit Plan 6MWT, gait speed, 5x STS, Quads strengthening, Periscapular strengthening, Lumbar flexion mobilization/stretching , thoracic extension mobilization, L hip stretching, dynamic core stability training (aviod 4 point)    PT Home Exercise Plan Develop advanced HEP for pt.    Consulted and Agree with Plan of Care Patient           G-Codes - 03-03-15 1621    Functional Assessment Tool Used FOTO: 37% impaired    Functional Limitation Mobility: Walking and moving around  Mobility: Walking and Moving Around Current Status [phone]) At least 20 percent but less than 40 percent impaired, limited or restricted   Mobility: Walking and Moving Around Goal Status [phone]) At least 20 percent but less than 40 percent impaired, limited or restricted       Problem List Patient Active Problem List   Diagnosis Date Noted  . Vaginal irritation 06/05/2014  . Yeast infection 06/05/2014    Buccola,Allan C 02/13/2015, 4:25 PM  4:26 PM  Etta Grandchild, PT, DPT [place] License # [id number]       [facility] [address] [place], [place], [address] Phone: [phone]   Fax:  [phone]

## 2015-02-17 ENCOUNTER — Telehealth (HOSPITAL_COMMUNITY): Payer: Self-pay

## 2015-02-17 ENCOUNTER — Encounter (HOSPITAL_COMMUNITY): Payer: 59

## 2015-02-17 NOTE — Telephone Encounter (Signed)
She could be having another attack of the shingles and Dr. Luan Pulling said for her to cx this apptment this apptment

## 2015-02-20 ENCOUNTER — Encounter (HOSPITAL_COMMUNITY): Payer: 59

## 2015-02-26 ENCOUNTER — Telehealth (HOSPITAL_COMMUNITY): Payer: Self-pay

## 2015-02-26 ENCOUNTER — Ambulatory Visit (HOSPITAL_COMMUNITY): Payer: 59

## 2015-02-26 NOTE — Telephone Encounter (Signed)
No show, called and left message about missed apt and next apt scheduled date and time, included contact info. [address] [place]; [facility] [phone]

## 2015-02-27 ENCOUNTER — Telehealth (HOSPITAL_COMMUNITY): Payer: Self-pay

## 2015-02-27 ENCOUNTER — Ambulatory Visit (HOSPITAL_COMMUNITY): Payer: 59

## 2015-02-27 NOTE — Telephone Encounter (Signed)
Requsted to be D/C due to having shingles and the MD advised that this could be the cause of all her pain

## 2015-03-03 ENCOUNTER — Encounter (HOSPITAL_COMMUNITY): Payer: 59

## 2015-03-06 ENCOUNTER — Encounter (HOSPITAL_COMMUNITY): Payer: 59

## 2015-03-10 ENCOUNTER — Encounter (HOSPITAL_COMMUNITY): Payer: 59

## 2015-03-13 ENCOUNTER — Encounter (HOSPITAL_COMMUNITY): Payer: 59

## 2015-04-08 ENCOUNTER — Encounter (HOSPITAL_COMMUNITY): Payer: Self-pay

## 2015-04-08 NOTE — Therapy (Signed)
[place], [place], [address] Phone: [phone]   Fax:  [phone]  Patient Details  Name: Renee Alvarez MRN: [id number] Date of Birth: 27-Apr-1961 Referring Provider:  No ref. provider found  Encounter Date: 04/08/2015  PHYSICAL THERAPY DISCHARGE SUMMARY  Visits from Start of Care: 1  Current functional level related to goals / functional outcomes: Evaluation only, pt did not return to continue with therapy.    Remaining deficits: Evaluation only, pt did not return to continue with therapy.   Education / Equipment: Evaluation only, pt did not return to continue with therapy.  Plan: Patient agrees to discharge.  Patient goals were not met. Patient is being discharged due to the patient's request.  ?????    PT and pt feel strongly that CC was largely related to new onset diagnosis of shingles. Functional goals were set up in the event that shingles was not the precipitating factor and that lumbar radiculopathy was the most likely secondary differential diagnosis. Pt encouraged to wait and see how treatment of shingles affected symptoms and pain. Pt agreeable to not return, reporting that she felt they were likely related to shingles. This PT supports pt decision and is now signing off. No further skilled services needed at this time.    Buccola,Allan C 04/08/2015, 3:06 PM 3:06 PM  Etta Grandchild, PT, DPT [place] License # [id number]       [facility] [address] [place], [place], [address] Phone: [phone]   Fax:  [phone]

## 2015-05-20 MED FILL — TELMISARTAN-HCTZ 80-25 MG T: 80-25 | 90 days supply | Qty: 90 | Fill #0

## 2015-09-02 MED FILL — TELMISARTAN-HCTZ 80-25 MG T: 80-25 | 90 days supply | Qty: 90 | Fill #1

## 2015-11-07 ENCOUNTER — Telehealth: Payer: 59 | Admitting: Family

## 2015-11-07 DIAGNOSIS — Z719 Counseling, unspecified: Secondary | ICD-10-CM

## 2015-12-10 MED FILL — TELMISARTAN-HCTZ 80-25 MG T: 80-25 | 90 days supply | Qty: 90 | Fill #2

## 2015-12-11 DIAGNOSIS — Z01419 Encounter for gynecological examination (general) (routine) without abnormal findings: Secondary | ICD-10-CM | POA: Diagnosis not present

## 2015-12-11 DIAGNOSIS — Z1231 Encounter for screening mammogram for malignant neoplasm of breast: Secondary | ICD-10-CM | POA: Diagnosis not present

## 2015-12-11 DIAGNOSIS — Z124 Encounter for screening for malignant neoplasm of cervix: Secondary | ICD-10-CM | POA: Diagnosis not present

## 2015-12-21 ENCOUNTER — Other Ambulatory Visit (HOSPITAL_COMMUNITY): Payer: Self-pay | Admitting: Pulmonary Disease

## 2015-12-21 DIAGNOSIS — M2550 Pain in unspecified joint: Secondary | ICD-10-CM | POA: Diagnosis not present

## 2015-12-21 DIAGNOSIS — B029 Zoster without complications: Secondary | ICD-10-CM | POA: Diagnosis not present

## 2015-12-21 DIAGNOSIS — Z0000 Encounter for general adult medical examination without abnormal findings: Secondary | ICD-10-CM | POA: Diagnosis not present

## 2015-12-21 DIAGNOSIS — Z780 Asymptomatic menopausal state: Secondary | ICD-10-CM

## 2016-01-01 ENCOUNTER — Ambulatory Visit (HOSPITAL_COMMUNITY)
Admission: RE | Admit: 2016-01-01 | Discharge: 2016-01-01 | Disposition: A | Discharge status: Home / Self Care | Payer: 59 | Source: Ambulatory Visit | Attending: Pulmonary Disease | Admitting: Pulmonary Disease

## 2016-01-01 DIAGNOSIS — Z780 Asymptomatic menopausal state: Secondary | ICD-10-CM | POA: Diagnosis not present

## 2016-01-01 DIAGNOSIS — R29890 Loss of height: Secondary | ICD-10-CM | POA: Diagnosis not present

## 2016-01-01 LAB — HM DEXA SCAN: HM Dexa Scan: NORMAL

## 2016-03-04 ENCOUNTER — Telehealth (INDEPENDENT_AMBULATORY_CARE_PROVIDER_SITE_OTHER): Payer: Self-pay | Admitting: Internal Medicine

## 2016-03-04 DIAGNOSIS — J0110 Acute frontal sinusitis, unspecified: Secondary | ICD-10-CM

## 2016-03-04 MED ORDER — AZITHROMYCIN 250 MG PO TABS: ORAL_TABLET | ORAL | 0 refills | Status: DC

## 2016-03-09 NOTE — Telephone Encounter (Signed)
error 

## 2016-03-25 ENCOUNTER — Telehealth (INDEPENDENT_AMBULATORY_CARE_PROVIDER_SITE_OTHER): Payer: Self-pay | Admitting: Internal Medicine

## 2016-03-25 DIAGNOSIS — J209 Acute bronchitis, unspecified: Secondary | ICD-10-CM

## 2016-03-25 DIAGNOSIS — J0120 Acute ethmoidal sinusitis, unspecified: Secondary | ICD-10-CM

## 2016-03-25 MED ORDER — LEVOFLOXACIN 500 MG PO TABS: 500.0000 mg | ORAL_TABLET | Freq: Every day | ORAL | 1 refills | Status: DC

## 2016-03-25 NOTE — Telephone Encounter (Signed)
Rx for Levaquin ordered

## 2016-03-28 MED FILL — TELMISARTAN-HCTZ 80-25 MG T: 80-25 | 90 days supply | Qty: 90 | Fill #3

## 2016-04-06 NOTE — Telephone Encounter (Signed)
err

## 2016-04-25 ENCOUNTER — Telehealth (INDEPENDENT_AMBULATORY_CARE_PROVIDER_SITE_OTHER): Payer: Self-pay | Admitting: Internal Medicine

## 2016-04-25 DIAGNOSIS — H6690 Otitis media, unspecified, unspecified ear: Secondary | ICD-10-CM

## 2016-04-25 MED ORDER — CIPROFLOXACIN-HYDROCORTISONE 0.2-1 % OT SUSP: 3.0000 [drp] | Freq: Two times a day (BID) | OTIC | 0 refills | Status: DC

## 2016-04-25 NOTE — Telephone Encounter (Signed)
Rx for Cipro Otic sent to her pharmacy.

## 2016-07-01 NOTE — Progress Notes (Signed)
11/07/15:  931AM  We cannot do an E-Visit for someone else on your chart. It is an official medical record and a part of your permanent record.  Please have this person set up an account and do an e-visit in her name. We will do the visit immediately. If this cannot wait, please take this person to an Urgent Care.   Thank you,  Guadelupe Sabin, DNP, FNP-BC [place] Team

## 2016-07-07 MED FILL — TELMISARTAN-HCTZ 80-25 MG T: 80-25 | 90 days supply | Qty: 90 | Fill #0

## 2016-09-28 DIAGNOSIS — H524 Presbyopia: Secondary | ICD-10-CM | POA: Diagnosis not present

## 2016-09-28 DIAGNOSIS — H5203 Hypermetropia, bilateral: Secondary | ICD-10-CM | POA: Diagnosis not present

## 2016-09-28 DIAGNOSIS — H52201 Unspecified astigmatism, right eye: Secondary | ICD-10-CM | POA: Diagnosis not present

## 2016-10-13 MED FILL — TELMISARTAN-HCTZ 80-25 MG T: 80-25 | 90 days supply | Qty: 90 | Fill #1

## 2017-01-11 MED FILL — TELMISARTAN-HCTZ 80-25 MG T: 80-25 | 90 days supply | Qty: 90 | Fill #2

## 2017-03-17 DIAGNOSIS — I10 Essential (primary) hypertension: Secondary | ICD-10-CM | POA: Diagnosis not present

## 2017-03-17 DIAGNOSIS — E669 Obesity, unspecified: Secondary | ICD-10-CM | POA: Diagnosis not present

## 2017-03-17 DIAGNOSIS — J301 Allergic rhinitis due to pollen: Secondary | ICD-10-CM | POA: Diagnosis not present

## 2017-03-17 DIAGNOSIS — M545 Low back pain: Secondary | ICD-10-CM | POA: Diagnosis not present

## 2017-03-20 DIAGNOSIS — M545 Low back pain: Secondary | ICD-10-CM | POA: Diagnosis not present

## 2017-03-20 DIAGNOSIS — J301 Allergic rhinitis due to pollen: Secondary | ICD-10-CM | POA: Diagnosis not present

## 2017-03-20 DIAGNOSIS — R739 Hyperglycemia, unspecified: Secondary | ICD-10-CM | POA: Diagnosis not present

## 2017-03-20 DIAGNOSIS — I10 Essential (primary) hypertension: Secondary | ICD-10-CM | POA: Diagnosis not present

## 2017-03-20 DIAGNOSIS — E669 Obesity, unspecified: Secondary | ICD-10-CM | POA: Diagnosis not present

## 2017-03-20 LAB — HEMOGLOBIN A1C: Hemoglobin A1C: 5.4

## 2017-03-31 DIAGNOSIS — Z1231 Encounter for screening mammogram for malignant neoplasm of breast: Secondary | ICD-10-CM | POA: Diagnosis not present

## 2017-03-31 DIAGNOSIS — Z01419 Encounter for gynecological examination (general) (routine) without abnormal findings: Secondary | ICD-10-CM | POA: Diagnosis not present

## 2017-05-15 MED FILL — TELMISARTAN-HCTZ 80-25 MG T: 80-25 | 90 days supply | Qty: 90 | Fill #3

## 2017-07-13 ENCOUNTER — Telehealth (INDEPENDENT_AMBULATORY_CARE_PROVIDER_SITE_OTHER): Payer: Self-pay | Admitting: Internal Medicine

## 2017-07-13 DIAGNOSIS — J209 Acute bronchitis, unspecified: Secondary | ICD-10-CM

## 2017-07-13 MED ORDER — LEVOFLOXACIN 500 MG PO TABS: 500.0000 mg | ORAL_TABLET | Freq: Every day | ORAL | 1 refills | Status: DC

## 2017-07-13 NOTE — Telephone Encounter (Signed)
rx sent to her pharmacy 

## 2017-08-28 MED FILL — TELMISARTAN-HCTZ 80-25 MG T: 80-25 | 90 days supply | Qty: 90 | Fill #0

## 2017-09-20 DIAGNOSIS — N950 Postmenopausal bleeding: Secondary | ICD-10-CM | POA: Diagnosis not present

## 2017-10-11 DIAGNOSIS — Z3202 Encounter for pregnancy test, result negative: Secondary | ICD-10-CM | POA: Diagnosis not present

## 2017-10-11 DIAGNOSIS — N950 Postmenopausal bleeding: Secondary | ICD-10-CM | POA: Diagnosis not present

## 2017-11-20 MED FILL — TELMISARTAN-HCTZ 80-25 MG T: 80-25 | 90 days supply | Qty: 90 | Fill #1

## 2018-02-28 MED FILL — TELMISARTAN-HCTZ 80-25 MG T: 80-25 | 90 days supply | Qty: 90 | Fill #2

## 2018-04-09 ENCOUNTER — Telehealth (INDEPENDENT_AMBULATORY_CARE_PROVIDER_SITE_OTHER): Payer: Self-pay | Admitting: Internal Medicine

## 2018-04-09 DIAGNOSIS — B379 Candidiasis, unspecified: Secondary | ICD-10-CM

## 2018-04-09 MED ORDER — NYSTATIN 100000 UNIT/GM EX POWD: Freq: Four times a day (QID) | CUTANEOUS | 0 refills | Status: DC

## 2018-04-09 NOTE — Telephone Encounter (Signed)
Rx sent to her pharmacy 

## 2018-04-18 MED FILL — METHYLPREDNISOLONE 4 MG TAB: 4 | 6 days supply | Qty: 21 | Fill #0

## 2018-04-20 DIAGNOSIS — M545 Low back pain: Secondary | ICD-10-CM | POA: Diagnosis not present

## 2018-04-20 DIAGNOSIS — M4696 Unspecified inflammatory spondylopathy, lumbar region: Secondary | ICD-10-CM | POA: Diagnosis not present

## 2018-06-07 MED FILL — TELMISARTAN-HCTZ 80-25 MG T: 80-25 | 90 days supply | Qty: 90 | Fill #3

## 2018-07-04 ENCOUNTER — Telehealth (INDEPENDENT_AMBULATORY_CARE_PROVIDER_SITE_OTHER): Payer: Self-pay | Admitting: Internal Medicine

## 2018-07-04 DIAGNOSIS — J320 Chronic maxillary sinusitis: Secondary | ICD-10-CM

## 2018-07-04 DIAGNOSIS — J209 Acute bronchitis, unspecified: Secondary | ICD-10-CM

## 2018-07-04 MED ORDER — LEVOFLOXACIN 500 MG PO TABS: 500.0000 mg | ORAL_TABLET | Freq: Every day | ORAL | 1 refills | Status: DC

## 2018-07-04 NOTE — Telephone Encounter (Signed)
Rx sent to her pharmacy 

## 2018-08-29 ENCOUNTER — Other Ambulatory Visit (INDEPENDENT_AMBULATORY_CARE_PROVIDER_SITE_OTHER): Payer: Self-pay | Admitting: Internal Medicine

## 2018-08-29 DIAGNOSIS — F321 Major depressive disorder, single episode, moderate: Secondary | ICD-10-CM | POA: Diagnosis not present

## 2018-08-29 DIAGNOSIS — M545 Low back pain: Secondary | ICD-10-CM | POA: Diagnosis not present

## 2018-08-29 DIAGNOSIS — K219 Gastro-esophageal reflux disease without esophagitis: Secondary | ICD-10-CM

## 2018-08-29 DIAGNOSIS — I10 Essential (primary) hypertension: Secondary | ICD-10-CM | POA: Diagnosis not present

## 2018-08-29 MED ORDER — PANTOPRAZOLE SODIUM 40 MG PO TBEC: 40.0000 mg | DELAYED_RELEASE_TABLET | Freq: Every day | ORAL | 1 refills | Status: DC | PRN

## 2018-08-29 NOTE — Telephone Encounter (Signed)
Prescription for pantoprazole 40 mg daily PRN refill.

## 2018-08-30 MED FILL — PANTOPRAZOLE SOD DR 40 MG T: 40 | 90 days supply | Qty: 90 | Fill #0

## 2018-08-30 MED FILL — TELMISARTAN-HCTZ 80-25 MG T: 80-25 | 90 days supply | Qty: 90 | Fill #0

## 2018-11-02 DIAGNOSIS — M25571 Pain in right ankle and joints of right foot: Secondary | ICD-10-CM | POA: Diagnosis not present

## 2018-12-14 DIAGNOSIS — W57XXXA Bitten or stung by nonvenomous insect and other nonvenomous arthropods, initial encounter: Secondary | ICD-10-CM | POA: Diagnosis not present

## 2018-12-14 DIAGNOSIS — J301 Allergic rhinitis due to pollen: Secondary | ICD-10-CM | POA: Diagnosis not present

## 2018-12-14 DIAGNOSIS — I10 Essential (primary) hypertension: Secondary | ICD-10-CM | POA: Diagnosis not present

## 2018-12-14 DIAGNOSIS — E669 Obesity, unspecified: Secondary | ICD-10-CM | POA: Diagnosis not present

## 2018-12-14 DIAGNOSIS — M545 Low back pain: Secondary | ICD-10-CM | POA: Diagnosis not present

## 2018-12-14 DIAGNOSIS — Z0000 Encounter for general adult medical examination without abnormal findings: Secondary | ICD-10-CM | POA: Diagnosis not present

## 2018-12-14 LAB — COMPREHENSIVE METABOLIC PANEL
Albumin: 4.4 (ref 3.5–5.0)
Calcium: 9.7 (ref 8.7–10.7)
GFR calc Af Amer: 69
GFR calc non Af Amer: 60
Globulin: 2.6

## 2018-12-14 LAB — VITAMIN D 25 HYDROXY (VIT D DEFICIENCY, FRACTURES): Vit D, 25-Hydroxy: 19

## 2018-12-14 LAB — LIPID PANEL
Cholesterol: 150 (ref 0–200)
HDL: 35 (ref 35–70)
LDL Cholesterol: 92
Triglycerides: 125 (ref 40–160)

## 2018-12-14 LAB — BASIC METABOLIC PANEL
BUN: 20 (ref 4–21)
CO2: 23 — AB (ref 13–22)
Chloride: 103 (ref 99–108)
Creatinine: 1
Glucose: 103
Potassium: 3.9 (ref 3.4–5.3)
Sodium: 138 (ref 137–147)

## 2018-12-14 LAB — TSH: TSH: 1.22 (ref <=5.90)

## 2018-12-14 LAB — HEPATIC FUNCTION PANEL
ALT: 18
AST: 14
Alkaline Phosphatase: 85 (ref 25–125)
Bilirubin, Total: 0.4

## 2018-12-14 LAB — CBC AND DIFFERENTIAL
HCT: 38 — AB (ref 39.0–52.0)
Hemoglobin: 12.9 — AB (ref 13.0–17.0)
Neutrophils Absolute: 2851
WBC: 5.4

## 2018-12-14 LAB — CBC: RBC: 4.29 (ref 3.87–5.11)

## 2019-01-05 MED FILL — TELMISARTAN-HCTZ 80-25 MG T: 80-25 | 90 days supply | Qty: 90 | Fill #1

## 2019-03-08 DIAGNOSIS — Z1231 Encounter for screening mammogram for malignant neoplasm of breast: Secondary | ICD-10-CM | POA: Diagnosis not present

## 2019-03-08 DIAGNOSIS — Z01419 Encounter for gynecological examination (general) (routine) without abnormal findings: Secondary | ICD-10-CM | POA: Diagnosis not present

## 2019-03-08 DIAGNOSIS — Z124 Encounter for screening for malignant neoplasm of cervix: Secondary | ICD-10-CM | POA: Diagnosis not present

## 2019-03-12 ENCOUNTER — Other Ambulatory Visit: Payer: Self-pay | Admitting: Obstetrics and Gynecology

## 2019-03-12 DIAGNOSIS — R928 Other abnormal and inconclusive findings on diagnostic imaging of breast: Secondary | ICD-10-CM

## 2019-03-15 ENCOUNTER — Other Ambulatory Visit: Payer: Self-pay | Admitting: Obstetrics and Gynecology

## 2019-03-15 ENCOUNTER — Other Ambulatory Visit: Payer: Self-pay

## 2019-03-15 ENCOUNTER — Ambulatory Visit
Admission: RE | Admit: 2019-03-15 | Discharge: 2019-03-15 | Disposition: A | Discharge status: Home / Self Care | Payer: 59 | Source: Ambulatory Visit | Attending: Obstetrics and Gynecology | Admitting: Obstetrics and Gynecology

## 2019-03-15 DIAGNOSIS — R928 Other abnormal and inconclusive findings on diagnostic imaging of breast: Secondary | ICD-10-CM

## 2019-03-15 DIAGNOSIS — N6001 Solitary cyst of right breast: Secondary | ICD-10-CM | POA: Diagnosis not present

## 2019-03-15 DIAGNOSIS — N63 Unspecified lump in unspecified breast: Secondary | ICD-10-CM

## 2019-03-15 LAB — HM MAMMOGRAPHY

## 2019-03-22 DIAGNOSIS — E669 Obesity, unspecified: Secondary | ICD-10-CM | POA: Diagnosis not present

## 2019-03-22 DIAGNOSIS — J301 Allergic rhinitis due to pollen: Secondary | ICD-10-CM | POA: Diagnosis not present

## 2019-03-22 DIAGNOSIS — I10 Essential (primary) hypertension: Secondary | ICD-10-CM | POA: Diagnosis not present

## 2019-03-22 DIAGNOSIS — M545 Low back pain: Secondary | ICD-10-CM | POA: Diagnosis not present

## 2019-03-26 ENCOUNTER — Other Ambulatory Visit: Payer: Self-pay | Admitting: Pulmonary Disease

## 2019-03-26 DIAGNOSIS — G8929 Other chronic pain: Secondary | ICD-10-CM

## 2019-04-01 DIAGNOSIS — B029 Zoster without complications: Secondary | ICD-10-CM | POA: Insufficient documentation

## 2019-04-01 DIAGNOSIS — E669 Obesity, unspecified: Secondary | ICD-10-CM

## 2019-04-01 DIAGNOSIS — I10 Essential (primary) hypertension: Secondary | ICD-10-CM

## 2019-04-01 DIAGNOSIS — J301 Allergic rhinitis due to pollen: Secondary | ICD-10-CM

## 2019-04-01 DIAGNOSIS — M545 Low back pain, unspecified: Secondary | ICD-10-CM

## 2019-04-03 ENCOUNTER — Other Ambulatory Visit (INDEPENDENT_AMBULATORY_CARE_PROVIDER_SITE_OTHER): Payer: Self-pay | Admitting: Internal Medicine

## 2019-04-03 DIAGNOSIS — K219 Gastro-esophageal reflux disease without esophagitis: Secondary | ICD-10-CM

## 2019-04-03 MED ORDER — PANTOPRAZOLE SODIUM 40 MG PO TBEC: 40.0000 mg | DELAYED_RELEASE_TABLET | Freq: Every day | ORAL | 3 refills | Status: DC

## 2019-04-19 MED FILL — TELMISARTAN-HCTZ 80-25 MG T: 80-25 | 90 days supply | Qty: 90 | Fill #2

## 2019-05-14 ENCOUNTER — Encounter: Payer: Self-pay | Admitting: Family Medicine

## 2019-05-15 ENCOUNTER — Encounter: Payer: Self-pay | Admitting: Family Medicine

## 2019-05-23 ENCOUNTER — Ambulatory Visit (INDEPENDENT_AMBULATORY_CARE_PROVIDER_SITE_OTHER): Payer: 59 | Admitting: Family Medicine

## 2019-05-23 ENCOUNTER — Other Ambulatory Visit: Payer: Self-pay

## 2019-05-23 VITALS — BP 132/78 | HR 72 | Temp 96.8°F | Resp 12 | Ht 67.0 in

## 2019-05-23 DIAGNOSIS — K219 Gastro-esophageal reflux disease without esophagitis: Secondary | ICD-10-CM

## 2019-05-23 DIAGNOSIS — J301 Allergic rhinitis due to pollen: Secondary | ICD-10-CM | POA: Diagnosis not present

## 2019-05-23 DIAGNOSIS — M545 Low back pain, unspecified: Secondary | ICD-10-CM

## 2019-05-23 DIAGNOSIS — N6001 Solitary cyst of right breast: Secondary | ICD-10-CM | POA: Diagnosis not present

## 2019-05-23 DIAGNOSIS — I10 Essential (primary) hypertension: Secondary | ICD-10-CM | POA: Diagnosis not present

## 2019-05-23 HISTORY — DX: Solitary cyst of right breast: N60.01

## 2019-05-23 NOTE — Patient Instructions (Signed)
labwork-NON fasting Keep appointment for u/s/mammo

## 2019-05-23 NOTE — Progress Notes (Signed)
New Patient Office Visit  Subjective:  Patient ID: Renee Alvarez, female    DOB: [birth year]  Age: 59 y.o. MRN: [id number]  CC:  Chief Complaint  Patient presents with  . Establish Care    New pt appointment   Back pain-takes vicodin prn GERD-protonix-stable HTN-micardis/HCT-stable HPI Renee Alvarez [facility] presents for HTN/GERD/right breast cyst GERD well controlled with protonix-watches diet HTN-micaradis/HCT daily-no headaches, no dizziness AR-zyrtec Back pain-flexeril/icodin prn Past Medical History:  Diagnosis Date  . Allergic rhinitis due to pollen   . Chronic back pain   . Dysphagia   . Essential (primary) hypertension   . GERD (gastroesophageal reflux disease)   . Hypertension   . Low back pain   . Obesity, unspecified   . PONV (postoperative nausea and vomiting)   . Seasonal allergies   . Vaginal irritation 06/05/2014  . Yeast infection 06/05/2014  . Zoster without complications     Past Surgical History:  Procedure Laterality Date  . BALLOON DILATION  05/04/2012   Procedure: BALLOON DILATION;  Surgeon: Rogene Houston, MD;  [facility];  Service: Endoscopy;  Laterality: N/A;  . [place]  . CHOLECYSTECTOMY    . COLONOSCOPY WITH ESOPHAGOGASTRODUODENOSCOPY (EGD)  05/04/2012   Procedure: COLONOSCOPY WITH ESOPHAGOGASTRODUODENOSCOPY (EGD);  Surgeon: Rogene Houston, MD;  [facility];  Service: Endoscopy;  Laterality: N/A;  730  . Cyst removed from right finger    . Expoloratory Lap    . MALONEY DILATION  05/04/2012   Procedure: MALONEY DILATION;  Surgeon: Rogene Houston, MD;  [facility];  Service: Endoscopy;  Laterality: N/A;  . SAVORY DILATION  05/04/2012   Procedure: SAVORY DILATION;  Surgeon: Rogene Houston, MD;  [facility];  Service: Endoscopy;  Laterality: N/A;    Family History  Problem Relation Age of Onset  . Cancer Mother        breast  . Breast cancer Mother   . Cancer Father        prostate   . Diabetes Sister   . Hypertension Sister   . Heart disease Maternal Grandfather   . Other Paternal Grandfather        cerebral hemorrhage  . Colon cancer Neg Hx     Social History   Socioeconomic History  . Marital status: Married    Spouse name: Not on file  . Number of children: Not on file  . Years of education: Not on file  . Highest education level: Not on file  Occupational History  . Not on file  Tobacco Use  . Smoking status: Never Smoker  . Smokeless tobacco: Never Used  Substance and Sexual Activity  . Alcohol use: No    Alcohol/week: 0.0 standard drinks  . Drug use: No  . Sexual activity: Yes    Birth control/protection: None  Other Topics Concern  . Not on file  Social History Narrative  . Not on file   Social Determinants of Health   Financial Resource Strain:   . Difficulty of Paying Living Expenses: Not on file  Food Insecurity:   . Worried About Charity fundraiser in the Last Year: Not on file  . Ran Out of Food in the Last Year: Not on file  Transportation Needs:   . Lack of Transportation (Medical): Not on file  . Lack of Transportation (Non-Medical): Not on file  Physical Activity:   . Days of Exercise per Week: Not on  file  . Minutes of Exercise per Session: Not on file  Stress:   . Feeling of Stress : Not on file  Social Connections:   . Frequency of Communication with Friends and Family: Not on file  . Frequency of Social Gatherings with Friends and Family: Not on file  . Attends Religious Services: Not on file  . Active Member of Clubs or Organizations: Not on file  . Attends Archivist Meetings: Not on file  . Marital Status: Not on file  Intimate Partner Violence:   . Fear of Current or Ex-Partner: Not on file  . Emotionally Abused: Not on file  . Physically Abused: Not on file  . Sexually Abused: Not on file    ROS Review of Systems  Constitutional: Positive for fatigue and unexpected weight change.  Endocrine:  Negative.   Musculoskeletal: Positive for arthralgias and back pain.  Allergic/Immunologic: Positive for environmental allergies.    Objective:   Today's Vitals: BP 132/78   Pulse 72   Temp (!) 96.8 F (36 C)   Resp 12   Ht 5\' 7"  (1.702 m)   BMI 41.50 kg/m   Physical Exam Vitals reviewed.  Constitutional:      Appearance: Normal appearance.  HENT:     Head: Normocephalic and atraumatic.  Eyes:     Conjunctiva/sclera: Conjunctivae normal.  Cardiovascular:     Rate and Rhythm: Normal rate and regular rhythm.     Pulses: Normal pulses.     Heart sounds: Normal heart sounds.  Pulmonary:     Effort: Pulmonary effort is normal.     Breath sounds: Normal breath sounds.  Musculoskeletal:        General: Normal range of motion.     Cervical back: Normal range of motion and neck supple.  Neurological:     General: No focal deficit present.     Mental Status: She is alert and oriented to person, place, and time.     Assessment & Plan:  1. Essential (primary) hypertension micardis/HCTZ -stable - Basic metabolic panel - Urinalysis  2. Gastroesophageal reflux disease without esophagitis protonix-stable 3. Breast cyst, right Followed by GYN[facility] pap and mammo  4. Low back pain, unspecified back pain laterality, unspecified chronicity, unspecified whether sciatica present Took vicodin in the past-d/w pt no narcotics -flexeril and mobic prn  5. Allergic rhinitis due to pollen, unspecified seasonality xyzal Outpatient Encounter Medications as of 05/23/2019  Medication Sig  . B Complex-C (B-COMPLEX WITH VITAMIN C) tablet Take 1 tablet by mouth daily.  . cetirizine (ZYRTEC) 10 MG tablet Take 10 mg by mouth daily.  . cholecalciferol (VITAMIN D) 1000 UNITS tablet Take 1,000 Units by mouth daily.  . cyclobenzaprine (FLEXERIL) 5 MG tablet Take 5 mg by mouth 3 (three) times daily as needed. Back spasms.  [name] Kitchen HYDROcodone-acetaminophen (VICODIN) 5-500 MG per tablet Take 1  tablet by mouth every 6 (six) hours as needed. Back pain.  . pantoprazole (PROTONIX) 40 MG tablet Take 1 tablet (40 mg total) by mouth daily before breakfast.  . telmisartan-hydrochlorothiazide (MICARDIS HCT) 80-25 MG per tablet Take 1 tablet by mouth daily.  . vitamin E 400 UNIT capsule Take 400 Units by mouth daily.  . [DISCONTINUED] azithromycin (ZITHROMAX) 250 MG tablet TAKE 2 TABLETS TODAY, THEN 1 TABLET DAILY FOR 4 DAYS.  . [DISCONTINUED] ciprofloxacin-hydrocortisone (CIPRO HC) otic suspension Place 3 drops into the left ear 2 (two) times daily.  . [DISCONTINUED] fluconazole (DIFLUCAN) 150 MG tablet Take 1 now  and 1 in 3 days  . [DISCONTINUED] levocetirizine (XYZAL) 5 MG tablet Take 5 mg by mouth daily as needed. Allergies.  . [DISCONTINUED] levofloxacin (LEVAQUIN) 500 MG tablet Take 1 tablet (500 mg total) by mouth daily.  . [DISCONTINUED] meloxicam (MOBIC) 15 MG tablet Take 1 tablet (15 mg total) by mouth daily as needed for pain. (Patient not taking: Reported on 06/05/2014)  . [DISCONTINUED] nystatin (MYCOSTATIN/NYSTOP) powder Apply topically 4 (four) times daily.  . [DISCONTINUED] nystatin-triamcinolone ointment (MYCOLOG) Apply 1 application topically 2 (two) times daily.   No facility-administered encounter medications on file as of 05/23/2019.    Follow-up: 6 months Keep OB/GYN appt mammogram 30 minutes to review old records -Dr. Luan Pulling, history, physical exam, assessment and plan Jazir Newey Hannah Beat, MD

## 2019-05-28 ENCOUNTER — Encounter: Payer: Self-pay | Admitting: Family Medicine

## 2019-07-26 MED FILL — TELMISARTAN-HCTZ 80-25 MG T: 80-25 | 90 days supply | Qty: 90 | Fill #3

## 2019-09-20 ENCOUNTER — Other Ambulatory Visit: Payer: Self-pay

## 2019-09-20 ENCOUNTER — Ambulatory Visit
Admission: RE | Admit: 2019-09-20 | Discharge: 2019-09-20 | Disposition: A | Discharge status: Home / Self Care | Payer: 59 | Source: Ambulatory Visit | Attending: Obstetrics and Gynecology | Admitting: Obstetrics and Gynecology

## 2019-09-20 DIAGNOSIS — R928 Other abnormal and inconclusive findings on diagnostic imaging of breast: Secondary | ICD-10-CM | POA: Diagnosis not present

## 2019-09-20 DIAGNOSIS — N63 Unspecified lump in unspecified breast: Secondary | ICD-10-CM

## 2019-09-20 DIAGNOSIS — N6313 Unspecified lump in the right breast, lower outer quadrant: Secondary | ICD-10-CM | POA: Diagnosis not present

## 2019-09-20 DIAGNOSIS — N6314 Unspecified lump in the right breast, lower inner quadrant: Secondary | ICD-10-CM | POA: Diagnosis not present

## 2019-10-03 ENCOUNTER — Ambulatory Visit: Payer: 59 | Admitting: Family Medicine

## 2019-10-04 ENCOUNTER — Other Ambulatory Visit: Payer: Self-pay

## 2019-10-04 ENCOUNTER — Encounter: Payer: Self-pay | Admitting: Family Medicine

## 2019-10-04 ENCOUNTER — Ambulatory Visit (INDEPENDENT_AMBULATORY_CARE_PROVIDER_SITE_OTHER): Payer: 59 | Admitting: Family Medicine

## 2019-10-04 VITALS — BP 138/84 | HR 72 | Temp 97.6°F | Ht 67.0 in | Wt 284.1 lb

## 2019-10-04 DIAGNOSIS — N950 Postmenopausal bleeding: Secondary | ICD-10-CM | POA: Diagnosis not present

## 2019-10-04 DIAGNOSIS — I10 Essential (primary) hypertension: Secondary | ICD-10-CM

## 2019-10-04 DIAGNOSIS — Z6841 Body Mass Index (BMI) 40.0 and over, adult: Secondary | ICD-10-CM

## 2019-10-04 MED ORDER — PHENTERMINE HCL 37.5 MG PO TABS: 37.5000 mg | ORAL_TABLET | Freq: Every day | ORAL | 0 refills | Status: DC

## 2019-10-04 NOTE — Patient Instructions (Signed)
I appreciate the opportunity to provide you with care for your health and wellness. Today we discussed: established care  Follow up: 4 weeks for wt check  Labs today  No referrals today  Great to meet you today, again sorry for delay.   Please continue to practice social distancing to keep you, your family, and our community safe.  If you must go out, please wear a mask and practice good handwashing.  It was a pleasure to see you and I look forward to continuing to work together on your health and well-being. Please do not hesitate to call the office if you need care or have questions about your care.  Have a wonderful day and week. With Gratitude, Cherly Beach, DNP, AGNP-BC

## 2019-10-04 NOTE — Progress Notes (Signed)
Subjective:  Patient ID: Renee Alvarez, female    DOB: [birth year]  Age: 59 y.o. MRN: [id number]  CC:  Chief Complaint  Patient presents with  . New Patient (Initial Visit)    was seeing dr Luan Pulling then dr Holly Bodily   . Weight Loss    would like to discuss weight loss options       HPI  HPI Renee Alvarez is a 59 year old female patient who presents today to establish care.  She has a history of hypertension, GERD, right breast cyst, obesity, allergies.  Overall she reports she is doing well and has not had any changes since she last saw Dr. Holly Bodily in January of this year to establish care with her.  She reports that her acid reflux is well controlled with Protonix and watching diet.  She reports taking her blood pressure medicine as directed.  Has no daily headaches and no dizziness.  Allergies have not been giving her trouble she uses Zyrtec as needed.  Her biggest concern today is to know if she can do something for weight loss.  She reports that she did not get her labs that were ordered earlier part of this year.  But that she will get them within the next week.  Blood pressure overall is doing well so will trial a 4-week of phentermine for weight loss.  She has had some postmenopausal bleeding and reports that she will be following up with her GYN later today.   Today patient denies signs and symptoms of COVID 19 infection including fever, chills, cough, shortness of breath, and headache. Past Medical, Surgical, Social History, Allergies, and Medications have been Reviewed.   Past Medical History:  Diagnosis Date  . Allergic rhinitis due to pollen   . Breast cyst, right 05/23/2019  . Chronic back pain   . Dysphagia   . Essential (primary) hypertension   . GERD (gastroesophageal reflux disease)   . Hypertension   . Low back pain   . Obesity, unspecified   . PONV (postoperative nausea and vomiting)   . Seasonal allergies   . Vaginal irritation 06/05/2014  . Vaginal irritation  06/05/2014  . Yeast infection 06/05/2014  . Zoster without complications     Current Meds  Medication Sig  . B Complex-C (B-COMPLEX WITH VITAMIN C) tablet Take 1 tablet by mouth daily.  . cetirizine (ZYRTEC) 10 MG tablet Take 10 mg by mouth daily.  . cholecalciferol (VITAMIN D) 1000 UNITS tablet Take 1,000 Units by mouth daily.  [name] Kitchen HYDROcodone-acetaminophen (NORCO/VICODIN) 5-325 MG tablet Take 1 tablet by mouth 4 (four) times daily as needed.  . methocarbamol (ROBAXIN) 500 MG tablet Take 500 mg by mouth every 6 (six) hours as needed.  . pantoprazole (PROTONIX) 40 MG tablet Take 1 tablet (40 mg total) by mouth daily before breakfast.  . telmisartan-hydrochlorothiazide (MICARDIS HCT) 80-25 MG per tablet Take 1 tablet by mouth daily.  . vitamin B-12 (CYANOCOBALAMIN) 1000 MCG tablet Take 1,000 mcg by mouth daily.  . vitamin E 400 UNIT capsule Take 400 Units by mouth daily.    ROS:  Review of Systems  Constitutional: Negative.        Discussion of weight loss  HENT: Negative.   Eyes: Negative.   Respiratory: Negative.   Cardiovascular: Negative.   Gastrointestinal: Negative.   Genitourinary: Negative.        Postmenopausal bleeding following up with GYN  Musculoskeletal: Negative.   Skin: Negative.   Neurological: Negative.   Endo/Heme/Allergies:  Negative.   Psychiatric/Behavioral: Negative.   All other systems reviewed and are negative.    Objective:   Today's Vitals: BP 138/84 (BP Location: Left Arm, Patient Position: Sitting, Cuff Size: Normal)   Pulse 72   Temp 97.6 F (36.4 C) (Temporal)   Ht 5\' 7"  (1.702 m)   Wt 284 lb 1.3 oz (128.9 kg) Comment: does not weigh  BMI 44.49 kg/m  Vitals with BMI 10/04/2019 05/23/2019 06/05/2014  Height 5\' 7"  5\' 7"  5\' 7"   Weight 284 lbs 1 oz - 265 lbs  BMI 123456 - 99991111  Systolic 0000000 [id number] 0000000  Diastolic 84 78 80  Pulse 72 72 -     Physical Exam Vitals and nursing note reviewed.  Constitutional:      Appearance: Normal appearance. She  is well-developed and well-groomed. She is morbidly obese.  HENT:     Head: Normocephalic and atraumatic.     Right Ear: External ear normal.     Left Ear: External ear normal.     Mouth/Throat:     Comments: Mask in place Eyes:     General:        Right eye: No discharge.        Left eye: No discharge.     Conjunctiva/sclera: Conjunctivae normal.  Cardiovascular:     Rate and Rhythm: Normal rate and regular rhythm.     Pulses: Normal pulses.     Heart sounds: Normal heart sounds.  Pulmonary:     Effort: Pulmonary effort is normal.     Breath sounds: Normal breath sounds.  Musculoskeletal:        General: Normal range of motion.     Cervical back: Normal range of motion and neck supple.  Skin:    General: Skin is warm.  Neurological:     General: No focal deficit present.     Mental Status: She is alert and oriented to person, place, and time.  Psychiatric:        Attention and Perception: Attention and perception normal.        Mood and Affect: Mood and affect normal.        Speech: Speech normal.        Behavior: Behavior normal. Behavior is cooperative.        Thought Content: Thought content normal.        Cognition and Memory: Cognition normal.        Judgment: Judgment normal.     Comments: Very pleasant throughout communication good eye contact      Assessment   1. Morbid obesity with BMI of 40.0-44.9, adult ([place])   2. Essential (primary) hypertension   3. Postmenopausal bleeding     Tests ordered Orders Placed This Encounter  Procedures  . CBC  . COMPLETE METABOLIC PANEL WITH GFR     Plan: Please see assessment and plan per problem list above.   Meds ordered this encounter  Medications  . phentermine (ADIPEX-P) 37.5 MG tablet    Sig: Take 1 tablet (37.5 mg total) by mouth daily before breakfast.    Dispense:  30 tablet    Refill:  0    Order Specific Question:   Supervising Provider    Answer:   Jacklynn Bue    Patient to  follow-up in 11/08/2019  Perlie Mayo, NP

## 2019-10-08 ENCOUNTER — Encounter: Payer: Self-pay | Admitting: Family Medicine

## 2019-10-08 DIAGNOSIS — N950 Postmenopausal bleeding: Secondary | ICD-10-CM | POA: Insufficient documentation

## 2019-10-08 DIAGNOSIS — E669 Obesity, unspecified: Secondary | ICD-10-CM | POA: Insufficient documentation

## 2019-10-08 NOTE — Assessment & Plan Note (Signed)
Blood pressure is controlled, would like to see her number status controlled while she is on the phentermine follow-up for blood pressure and weight check in the near future.  Encouraged to work on diet and exercise.

## 2019-10-08 NOTE — Assessment & Plan Note (Signed)
Reports that she is not seeing her GYN today and will follow up with [place] as information is provided to her.

## 2019-10-08 NOTE — Assessment & Plan Note (Signed)
Weight loss was her biggest concern today.  We will try 4-week course of phentermine.  Advised not to rely fully on the pill and to work on diet and lifestyle changes.  We will follow-up for weight check and blood pressure check in 4 weeks.

## 2019-10-09 ENCOUNTER — Encounter: Payer: Self-pay | Admitting: Family Medicine

## 2019-10-10 DIAGNOSIS — I10 Essential (primary) hypertension: Secondary | ICD-10-CM | POA: Diagnosis not present

## 2019-10-10 LAB — COMPLETE METABOLIC PANEL WITH GFR
AG Ratio: 1.6 (calc) (ref 1.0–2.5)
ALT: 16 U/L (ref 6–29)
AST: 14 U/L (ref 10–35)
Albumin: 4.2 g/dL (ref 3.6–5.1)
Alkaline phosphatase (APISO): 99 U/L (ref 37–153)
BUN: 18 mg/dL (ref 7–25)
CO2: 28 mmol/L (ref 20–32)
Calcium: 9.4 mg/dL (ref 8.6–10.4)
Chloride: 103 mmol/L (ref 98–110)
Creat: 0.92 mg/dL (ref 0.50–0.99)
GFR, Est African American: 78 mL/min/{1.73_m2} (ref >=60)
GFR, Est Non African American: 67 mL/min/{1.73_m2} (ref >=60)
Globulin: 2.7 g/dL (calc) (ref 1.9–3.7)
Glucose, Bld: 106 mg/dL — ABNORMAL HIGH (ref 65–99)
Potassium: 4 mmol/L (ref 3.5–5.3)
Sodium: 138 mmol/L (ref 135–146)
Total Bilirubin: 0.5 mg/dL (ref 0.2–1.2)
Total Protein: 6.9 g/dL (ref 6.1–8.1)

## 2019-10-10 LAB — CBC
HCT: 36 % (ref 35.0–45.0)
Hemoglobin: 12.1 g/dL (ref 11.7–15.5)
MCH: 29.4 pg (ref 27.0–33.0)
MCHC: 33.6 g/dL (ref 32.0–36.0)
MCV: 87.6 fL (ref 80.0–100.0)
MPV: 10.8 fL (ref 7.5–12.5)
Platelets: 231 10*3/uL (ref 140–400)
RBC: 4.11 10*6/uL (ref 3.80–5.10)
RDW: 12.7 % (ref 11.0–15.0)
WBC: 6.7 10*3/uL (ref 3.8–10.8)

## 2019-10-30 ENCOUNTER — Other Ambulatory Visit: Payer: Self-pay

## 2019-10-30 MED ORDER — TELMISARTAN-HCTZ 80-25 MG PO TABS: 1.0000 | ORAL_TABLET | Freq: Every day | ORAL | 0 refills | Status: DC

## 2019-10-30 MED FILL — TELMISARTAN-HCTZ 80-25 MG T: 80-25 | 90 days supply | Qty: 90 | Fill #0

## 2019-11-04 ENCOUNTER — Telehealth (INDEPENDENT_AMBULATORY_CARE_PROVIDER_SITE_OTHER): Payer: Self-pay | Admitting: Gastroenterology

## 2019-11-04 MED ORDER — AZITHROMYCIN 250 MG PO TABS
ORAL_TABLET | ORAL | 0 refills | Status: DC
Start: 2019-11-04 — End: 2020-04-07

## 2019-11-04 NOTE — Telephone Encounter (Signed)
Symptoms started yesterday, having runny nose (clear), ear pressure, sinus pressure. Afebrile. Has done well w/ zpack w/ similar symptoms in past. Will send to pharmacy. Patient to notify pcp if not improving.   Only allergy is PCN

## 2019-11-08 ENCOUNTER — Ambulatory Visit: Payer: 59 | Admitting: Family Medicine

## 2019-12-05 ENCOUNTER — Ambulatory Visit: Payer: 59 | Admitting: Family Medicine

## 2020-01-23 ENCOUNTER — Other Ambulatory Visit (INDEPENDENT_AMBULATORY_CARE_PROVIDER_SITE_OTHER): Payer: Self-pay | Admitting: Gastroenterology

## 2020-01-23 MED ORDER — KETOCONAZOLE 2 % EX CREA: 1.0000 "application " | TOPICAL_CREAM | Freq: Two times a day (BID) | CUTANEOUS | 1 refills | Status: DC

## 2020-02-12 ENCOUNTER — Other Ambulatory Visit: Payer: Self-pay | Admitting: Family Medicine

## 2020-02-12 MED FILL — PANTOPRAZOLE SOD DR 40 MG T: 40 | 90 days supply | Qty: 90 | Fill #0

## 2020-02-12 MED FILL — PROGESTERONE 200 MG CAPS: 200 | 30 days supply | Qty: 30 | Fill #0

## 2020-02-16 ENCOUNTER — Encounter: Payer: Self-pay | Admitting: Family Medicine

## 2020-02-17 ENCOUNTER — Other Ambulatory Visit: Payer: Self-pay | Admitting: *Deleted

## 2020-02-17 MED ORDER — TELMISARTAN-HCTZ 80-25 MG PO TABS: 1.0000 | ORAL_TABLET | Freq: Every day | ORAL | 0 refills | Status: DC

## 2020-02-17 MED FILL — TELMISARTAN-HCTZ 80-25 MG T: 80-25 | 90 days supply | Qty: 90 | Fill #0

## 2020-03-23 ENCOUNTER — Ambulatory Visit (INDEPENDENT_AMBULATORY_CARE_PROVIDER_SITE_OTHER): Payer: 59 | Admitting: Podiatry

## 2020-03-23 ENCOUNTER — Ambulatory Visit (INDEPENDENT_AMBULATORY_CARE_PROVIDER_SITE_OTHER): Payer: 59

## 2020-03-23 ENCOUNTER — Other Ambulatory Visit: Payer: Self-pay

## 2020-03-23 VITALS — BP 130/82 | Temp 98.0°F

## 2020-03-23 DIAGNOSIS — M1000 Idiopathic gout, unspecified site: Secondary | ICD-10-CM | POA: Diagnosis not present

## 2020-03-23 DIAGNOSIS — M779 Enthesopathy, unspecified: Secondary | ICD-10-CM | POA: Diagnosis not present

## 2020-03-23 DIAGNOSIS — M79671 Pain in right foot: Secondary | ICD-10-CM

## 2020-03-23 DIAGNOSIS — S93401A Sprain of unspecified ligament of right ankle, initial encounter: Secondary | ICD-10-CM | POA: Diagnosis not present

## 2020-03-23 MED ORDER — METHYLPREDNISOLONE 4 MG PO TBPK
ORAL_TABLET | ORAL | 0 refills | Status: DC
Start: 2020-03-23 — End: 2020-06-12

## 2020-03-24 ENCOUNTER — Encounter: Payer: Self-pay | Admitting: Podiatry

## 2020-03-24 LAB — URIC ACID: Uric Acid, Serum: 8.2 mg/dL — ABNORMAL HIGH (ref 2.5–7.0)

## 2020-03-24 LAB — BASIC METABOLIC PANEL
BUN/Creatinine Ratio: 21 (calc) (ref 6–22)
BUN: 21 mg/dL (ref 7–25)
CO2: 31 mmol/L (ref 20–32)
Calcium: 10 mg/dL (ref 8.6–10.4)
Chloride: 102 mmol/L (ref 98–110)
Creat: 1.01 mg/dL — ABNORMAL HIGH (ref 0.50–0.99)
Glucose, Bld: 109 mg/dL (ref 65–139)
Potassium: 4 mmol/L (ref 3.5–5.3)
Sodium: 140 mmol/L (ref 135–146)

## 2020-03-25 ENCOUNTER — Other Ambulatory Visit: Payer: Self-pay | Admitting: Podiatry

## 2020-03-25 DIAGNOSIS — M1000 Idiopathic gout, unspecified site: Secondary | ICD-10-CM

## 2020-03-25 NOTE — Progress Notes (Signed)
Subjective:   Patient ID: Renee Alvarez, female   DOB: 59 y.o.   MRN: [id number]   HPI 59 year old female presents the office with concerns of right ankle pain.  She states that on Friday she did change her shoes and by Friday night she was having increased pain to her foot.  By Saturday she said the foot was pulsating and felt bruised.  She did purchase new brace over the weekend to help with the ankle given the pain on Friday when she is not sure if this is what caused the sensation.  She states that by Sunday she is not able to put weight on her foot and she noted some redness and swelling to the lateral aspect of the foot.  She is tried NSAIDs, muscle relaxer as well as ice.  She is also try Tiger balm.  No injury or falls.   Review of Systems  All other systems reviewed and are negative.   Past Medical History:  Diagnosis Date  . Allergic rhinitis due to pollen   . Breast cyst, right 05/23/2019  . Chronic back pain   . Dysphagia   . Essential (primary) hypertension   . GERD (gastroesophageal reflux disease)   . Hypertension   . Low back pain   . Obesity, unspecified   . PONV (postoperative nausea and vomiting)   . Seasonal allergies   . Vaginal irritation 06/05/2014  . Vaginal irritation 06/05/2014  . Yeast infection 06/05/2014  . Zoster without complications     Past Surgical History:  Procedure Laterality Date  . BALLOON DILATION  05/04/2012   Procedure: BALLOON DILATION;  Surgeon: Rogene Houston, MD;  [facility];  Service: Endoscopy;  Laterality: N/A;  . [place]  . CHOLECYSTECTOMY    . COLONOSCOPY WITH ESOPHAGOGASTRODUODENOSCOPY (EGD)  05/04/2012   Procedure: COLONOSCOPY WITH ESOPHAGOGASTRODUODENOSCOPY (EGD);  Surgeon: Rogene Houston, MD;  [facility]: [facility];  Service: Endoscopy;  Laterality: N/A;  730  . Cyst removed from right finger    . Expoloratory Lap    . MALONEY DILATION  05/04/2012   Procedure: MALONEY DILATION;  Surgeon: Rogene Houston, MD;  [facility];  Service: Endoscopy;  Laterality: N/A;  . SAVORY DILATION  05/04/2012   Procedure: SAVORY DILATION;  Surgeon: Rogene Houston, MD;  [facility];  Service: Endoscopy;  Laterality: N/A;     Current Outpatient Medications:  .  azithromycin (ZITHROMAX) 250 MG tablet, Take 2 pills day one then 1 pill days 2-5, Disp: 6 each, Rfl: 0 .  B Complex-C (B-COMPLEX WITH VITAMIN C) tablet, Take 1 tablet by mouth daily., Disp: , Rfl:  .  cetirizine (ZYRTEC) 10 MG tablet, Take 10 mg by mouth daily., Disp: , Rfl:  .  cholecalciferol (VITAMIN D) 1000 UNITS tablet, Take 1,000 Units by mouth daily., Disp: , Rfl:  .  HYDROcodone-acetaminophen (NORCO/VICODIN) 5-325 MG tablet, Take 1 tablet by mouth 4 (four) times daily as needed., Disp: , Rfl:  .  ketoconazole (NIZORAL) 2 % cream, Apply 1 application topically 2 (two) times daily., Disp: 30 g, Rfl: 1 .  methocarbamol (ROBAXIN) 500 MG tablet, Take 500 mg by mouth every 6 (six) hours as needed., Disp: , Rfl:  .  methylPREDNISolone (MEDROL DOSEPAK) 4 MG TBPK tablet, Take as directed, Disp: 21 tablet, Rfl: 0 .  pantoprazole (PROTONIX) 40 MG tablet, Take 1 tablet (40 mg total) by mouth daily before breakfast., Disp: 90 tablet, Rfl:  3 .  phentermine (ADIPEX-P) 37.5 MG tablet, Take 1 tablet (37.5 mg total) by mouth daily before breakfast., Disp: 30 tablet, Rfl: 0 .  telmisartan-hydrochlorothiazide (MICARDIS HCT) 80-25 MG tablet, Take 1 tablet by mouth daily., Disp: 90 tablet, Rfl: 0 .  vitamin B-12 (CYANOCOBALAMIN) 1000 MCG tablet, Take 1,000 mcg by mouth daily., Disp: , Rfl:  .  vitamin E 400 UNIT capsule, Take 400 Units by mouth daily., Disp: , Rfl:   Allergies  Allergen Reactions  . Penicillins Rash        Objective:  Physical Exam  General: AAO x3, NAD  Dermatological: Skin is warm, dry and supple bilateral.  There are no open sores, no preulcerative lesions, no rash or signs of infection present.  Vascular:  Dorsalis Pedis artery and Posterior Tibial artery pedal pulses are 2/4 bilateral with immedate capillary fill time.  There is no pain with calf compression, swelling, warmth, erythema.   Neruologic: Grossly intact via light touch bilateral.   Musculoskeletal: There is tenderness palpation mostly the lateral aspect the right foot on sinus tarsi and also the midtarsal joint.  There is edema localized area there is some warmth as well.  There is no open lesions.  There is no areas of fluctuation crepitation.  No pain with ankle joint.  Muscular strength 5/5 in all groups tested bilateral.  Gait: Unassisted, Nonantalgic.       Assessment:   59 year old female right foot pain, likely gout     Plan:  -Treatment options discussed including all alternatives, risks, and complications -Etiology of symptoms were discussed -X-rays were obtained and reviewed with the patient.  No evidence of acute fracture or stress fracture. -Given no injury there is swelling as well as redness and the pain that she is experienced I do think she is experiencing gout.  We will check a uric acid level.  Prescribed a Medrol Dosepak.  Cam boot was dispensed for immobilization.  Elevation.  Trula Slade DPM

## 2020-04-07 ENCOUNTER — Telehealth (INDEPENDENT_AMBULATORY_CARE_PROVIDER_SITE_OTHER): Payer: Self-pay | Admitting: Gastroenterology

## 2020-04-07 MED ORDER — AZITHROMYCIN 250 MG PO TABS: ORAL_TABLET | ORAL | 0 refills | Status: DC

## 2020-04-07 NOTE — Telephone Encounter (Signed)
Discussed w/ patient - having runny nose, sinus congestion, etc. Afebrile. Chronic hx of similar symptoms. Will Rx Zpack to pharmacy. To f/up with PCP if does not improve.

## 2020-04-08 DIAGNOSIS — M1000 Idiopathic gout, unspecified site: Secondary | ICD-10-CM | POA: Diagnosis not present

## 2020-04-09 LAB — BASIC METABOLIC PANEL
BUN/Creatinine Ratio: 19 (calc) (ref 6–22)
BUN: 21 mg/dL (ref 7–25)
CO2: 28 mmol/L (ref 20–32)
Calcium: 9.6 mg/dL (ref 8.6–10.4)
Chloride: 99 mmol/L (ref 98–110)
Creat: 1.1 mg/dL — ABNORMAL HIGH (ref 0.50–0.99)
Glucose, Bld: 115 mg/dL (ref 65–139)
Potassium: 4.1 mmol/L (ref 3.5–5.3)
Sodium: 136 mmol/L (ref 135–146)

## 2020-04-09 LAB — URIC ACID: Uric Acid, Serum: 8.1 mg/dL — ABNORMAL HIGH (ref 2.5–7.0)

## 2020-04-13 ENCOUNTER — Ambulatory Visit: Payer: 59 | Admitting: Podiatry

## 2020-04-13 ENCOUNTER — Other Ambulatory Visit: Payer: Self-pay | Admitting: Podiatry

## 2020-04-13 ENCOUNTER — Encounter: Payer: Self-pay | Admitting: Podiatry

## 2020-04-13 ENCOUNTER — Other Ambulatory Visit: Payer: Self-pay

## 2020-04-13 DIAGNOSIS — M1000 Idiopathic gout, unspecified site: Secondary | ICD-10-CM | POA: Diagnosis not present

## 2020-04-13 DIAGNOSIS — L603 Nail dystrophy: Secondary | ICD-10-CM

## 2020-04-13 DIAGNOSIS — M779 Enthesopathy, unspecified: Secondary | ICD-10-CM

## 2020-04-13 DIAGNOSIS — B351 Tinea unguium: Secondary | ICD-10-CM | POA: Diagnosis not present

## 2020-04-13 MED ORDER — ALLOPURINOL 100 MG PO TABS: 100.0000 mg | ORAL_TABLET | Freq: Every day | ORAL | 1 refills | Status: DC

## 2020-04-13 MED FILL — ALLOPURINOL 100 MG TABS: 100 | 30 days supply | Qty: 30 | Fill #0

## 2020-04-15 NOTE — Progress Notes (Signed)
Subjective: 59 year old female presents the office today for follow-up evaluation of right ankle pain.  She states that she finished the medication, Medrol Dosepak, and she is feeling much better.  She did have repeat labs performed to recheck uric acid level.  She denies any severe changes since last saw her.  Discussed concerns about her nails becoming thickened discolored.  No pain in the nails.  No redness or drainage.  Denies any systemic complaints such as fevers, chills, nausea, vomiting. No acute changes since last appointment, and no other complaints at this time.   Objective: AAO x3, NAD DP/PT pulses palpable bilaterally, CRT less than 3 seconds Additional symptoms of ankle resolved there is no area of tenderness identified there is no significant edema there is no erythema or warmth.  Flexor, extensor tendons appear to be intact.  MMT 5/5.  Nails appear to be hypertrophic, dystrophic we will discoloration.  There is no open lesions.  No edema, erythema to the toenail sites.  No pain with calf compression, swelling, warmth, erythema  Assessment: Gout; onychomycosis  Plan: -All treatment options discussed with the patient including all alternatives, risks, complications.  -Upon repeat uric acid level is still at 8.1.  I prescribed allopurinol 100 mg daily.  Also creatinine is at 1.1.  Recommend to follow-up with her primary care physician as well for this. -Diet modifications and hydration. -Nails debrided and sent for culture. -Patient encouraged to call the office with any questions, concerns, change in symptoms.   Trula Slade DPM   Cc: Cherly Beach, NP

## 2020-04-20 MED FILL — PROGESTERONE 200 MG CAPS: 200 | 30 days supply | Qty: 30 | Fill #1

## 2020-04-24 ENCOUNTER — Other Ambulatory Visit (HOSPITAL_COMMUNITY): Payer: Self-pay | Admitting: Obstetrics and Gynecology

## 2020-04-24 DIAGNOSIS — Z1231 Encounter for screening mammogram for malignant neoplasm of breast: Secondary | ICD-10-CM | POA: Diagnosis not present

## 2020-04-24 DIAGNOSIS — Z01419 Encounter for gynecological examination (general) (routine) without abnormal findings: Secondary | ICD-10-CM | POA: Diagnosis not present

## 2020-04-24 DIAGNOSIS — N760 Acute vaginitis: Secondary | ICD-10-CM | POA: Diagnosis not present

## 2020-04-24 DIAGNOSIS — R309 Painful micturition, unspecified: Secondary | ICD-10-CM | POA: Diagnosis not present

## 2020-05-05 ENCOUNTER — Other Ambulatory Visit (HOSPITAL_COMMUNITY): Payer: Self-pay | Admitting: Obstetrics and Gynecology

## 2020-05-05 MED FILL — METRONIDAZOLE 500 MG TABS: 500 | 7 days supply | Qty: 14 | Fill #0

## 2020-05-05 MED FILL — FLUCONAZOLE 150 MG TABS: 150 | 3 days supply | Qty: 2 | Fill #0

## 2020-05-14 ENCOUNTER — Ambulatory Visit: Payer: 59 | Attending: Internal Medicine

## 2020-05-14 DIAGNOSIS — Z23 Encounter for immunization: Secondary | ICD-10-CM

## 2020-05-14 NOTE — Progress Notes (Signed)
   Covid-19 Vaccination Clinic  Name:  Renee Alvarez    MRN: [id number] DOB: Jan 14, 1961  05/14/2020  [name] was observed post Covid-19 immunization for 15 minutes without incident. She was provided with Vaccine Information Sheet and instruction to access the V-Safe system.   [name] was instructed to call 911 with any severe reactions post vaccine: [name] Kitchen Difficulty breathing  . Swelling of face and throat  . A fast heartbeat  . A bad rash all over body  . Dizziness and weakness   Immunizations Administered    Name Date Dose VIS Date Route   Pfizer COVID-19 Vaccine 05/14/2020  1:08 PM 0.3 mL 03/04/2020 Intramuscular   Manufacturer: [employer], [employer]   Lot: [id number]   NDC: [address]-3

## 2020-05-21 MED FILL — PROGESTERONE 200 MG CAPS: 200 | 90 days supply | Qty: 90 | Fill #0

## 2020-05-26 ENCOUNTER — Other Ambulatory Visit: Payer: Self-pay | Admitting: Family Medicine

## 2020-05-27 ENCOUNTER — Other Ambulatory Visit: Payer: Self-pay | Admitting: Family Medicine

## 2020-05-27 ENCOUNTER — Encounter: Payer: Self-pay | Admitting: Podiatry

## 2020-05-27 MED FILL — TELMISARTAN-HCTZ 80-25 MG T: 80-25 | 90 days supply | Qty: 90 | Fill #0

## 2020-05-28 ENCOUNTER — Encounter: Payer: Self-pay | Admitting: Podiatry

## 2020-05-29 ENCOUNTER — Ambulatory Visit: Payer: 59 | Admitting: Podiatry

## 2020-06-02 ENCOUNTER — Encounter: Payer: Self-pay | Admitting: Podiatry

## 2020-06-03 ENCOUNTER — Other Ambulatory Visit: Payer: Self-pay | Admitting: Obstetrics and Gynecology

## 2020-06-03 ENCOUNTER — Telehealth: Payer: Self-pay | Admitting: *Deleted

## 2020-06-03 DIAGNOSIS — N939 Abnormal uterine and vaginal bleeding, unspecified: Secondary | ICD-10-CM

## 2020-06-03 NOTE — Addendum Note (Signed)
Addended by: Cranford Mon R on: 06/03/2020 11:52 AM   Modules accepted: Orders

## 2020-06-03 NOTE — Telephone Encounter (Signed)
-----   Message from Trula Slade, DPM sent at 06/02/2020  6:40 PM EST ----- Did you ever get the copy of her [place] [place] results? They are not scanned in yet and I need to see them. She keeps calling and I cannot see them in the portal.

## 2020-06-03 NOTE — Telephone Encounter (Signed)
Called and spoke with [facility] a third time to get the report sent and spoke with [place] and representative sent the report to me in [place] at [phone] and sent a report to Dr Jacqualyn Posey. Lattie Haw

## 2020-06-10 ENCOUNTER — Encounter: Payer: Self-pay | Admitting: Podiatry

## 2020-06-10 MED FILL — ALLOPURINOL 100 MG TABS: 100 | 30 days supply | Qty: 30 | Fill #1

## 2020-06-12 ENCOUNTER — Encounter: Payer: Self-pay | Admitting: Internal Medicine

## 2020-06-12 ENCOUNTER — Telehealth: Payer: 59 | Admitting: Internal Medicine

## 2020-06-12 ENCOUNTER — Other Ambulatory Visit: Payer: Self-pay

## 2020-06-12 ENCOUNTER — Other Ambulatory Visit: Payer: Self-pay | Admitting: Internal Medicine

## 2020-06-12 DIAGNOSIS — R7989 Other specified abnormal findings of blood chemistry: Secondary | ICD-10-CM | POA: Diagnosis not present

## 2020-06-12 DIAGNOSIS — Z6841 Body Mass Index (BMI) 40.0 and over, adult: Secondary | ICD-10-CM | POA: Diagnosis not present

## 2020-06-12 DIAGNOSIS — M109 Gout, unspecified: Secondary | ICD-10-CM | POA: Diagnosis not present

## 2020-06-12 DIAGNOSIS — I10 Essential (primary) hypertension: Secondary | ICD-10-CM | POA: Diagnosis not present

## 2020-06-12 DIAGNOSIS — N951 Menopausal and female climacteric states: Secondary | ICD-10-CM | POA: Insufficient documentation

## 2020-06-12 DIAGNOSIS — F419 Anxiety disorder, unspecified: Secondary | ICD-10-CM | POA: Diagnosis not present

## 2020-06-12 MED ORDER — ESCITALOPRAM OXALATE 10 MG PO TABS: 10.0000 mg | ORAL_TABLET | Freq: Every day | ORAL | 2 refills | Status: DC

## 2020-06-12 MED FILL — ESCITALOPRAM 10 MG TABLET: 10 | 30 days supply | Qty: 30 | Fill #0

## 2020-06-12 NOTE — Patient Instructions (Signed)
Please get fasting blood tests done before the next visit.  Please contact the office to schedule your annual physical.  Please continue taking medications as prescribed.  Please stay hydrated by taking at least 2 liters of fluid in a day.

## 2020-06-12 NOTE — Progress Notes (Signed)
Virtual Visit via Video Note   This visit type was conducted due to national recommendations for restrictions regarding the COVID-19 Pandemic (e.g. social distancing) in an effort to limit this patient's exposure and mitigate transmission in our community.  Due to her co-morbid illnesses, this patient is at least at moderate risk for complications without adequate follow up.  This format is felt to be most appropriate for this patient at this time.  All issues noted in this document were discussed and addressed.  A limited physical exam was performed with this format.     Evaluation Performed:  Follow-up visit  Date:  06/12/2020   ID:  Renee, Alvarez 1961/03/11, MRN [id number]  Patient Location: Home/work Provider Location: Office/Clinic  Participants: Patient Location of Patient: Home Location of Provider: Telehealth Consent was obtain for visit to be over via telehealth. I verified that I am speaking with the correct person using two identifiers.  PCP:  Perlie Mayo, NP   Chief Complaint: Blood tests review and anxiety  History of Present Illness:    Renee Alvarez is a 60 y.o. female with PMH of HTN, GERD and anxiety who has a televisit for concern for her kidney function noted in her last BMP done by Podiatrist.  She had been diagnosed with gout and has been started on Allopurinol. Her BMP also suggested creatinine of 1.1, and she was advised to follow up with PCP for it. Previous BMP were reviewed, and Cr. has been around ~1. No reports of any urinary hesitance or incontinence currently.  She has started taking Lexapro since yesterday due to her anxiety, which she attributes to current pandemic situation and her OB/GYN concerns - bleeding, treated with Progesterone. She was prescribed 20 mg Lexapro in the past, which she had stopped taking. She has started taking 10 mg for now and states that she feels little better today. Denies anhedonia or change in activity  recently.  The patient does not have symptoms concerning for COVID-19 infection (fever, chills, cough, or new shortness of breath).   Past Medical, Surgical, Social History, Allergies, and Medications have been Reviewed.  Past Medical History:  Diagnosis Date  . Allergic rhinitis due to pollen   . Breast cyst, right 05/23/2019  . Chronic back pain   . Dysphagia   . Essential (primary) hypertension   . GERD (gastroesophageal reflux disease)   . Hypertension   . Low back pain   . Obesity, unspecified   . PONV (postoperative nausea and vomiting)   . Seasonal allergies   . Vaginal irritation 06/05/2014  . Vaginal irritation 06/05/2014  . Yeast infection 06/05/2014  . Zoster without complications    Past Surgical History:  Procedure Laterality Date  . BALLOON DILATION  05/04/2012   Procedure: BALLOON DILATION;  Surgeon: Rogene Houston, MD;  [facility];  Service: Endoscopy;  Laterality: N/A;  . [place]  . CHOLECYSTECTOMY    . COLONOSCOPY WITH ESOPHAGOGASTRODUODENOSCOPY (EGD)  05/04/2012   Procedure: COLONOSCOPY WITH ESOPHAGOGASTRODUODENOSCOPY (EGD);  Surgeon: Rogene Houston, MD;  [facility];  Service: Endoscopy;  Laterality: N/A;  730  . Cyst removed from right finger    . Expoloratory Lap    . MALONEY DILATION  05/04/2012   Procedure: MALONEY DILATION;  Surgeon: Rogene Houston, MD;  [facility];  Service: Endoscopy;  Laterality: N/A;  . SAVORY DILATION  05/04/2012   Procedure: SAVORY DILATION;  Surgeon: Bernadene Person  Gloriann Loan, MD;  [facility];  Service: Endoscopy;  Laterality: N/A;     Current Meds  Medication Sig  . allopurinol (ZYLOPRIM) 100 MG tablet Take 1 tablet (100 mg total) by mouth daily.  . cetirizine (ZYRTEC) 10 MG tablet Take 10 mg by mouth daily.  [name] Kitchen escitalopram (LEXAPRO) 10 MG tablet Take 1 tablet (10 mg total) by mouth daily.  [name] Kitchen HYDROcodone-acetaminophen (NORCO/VICODIN) 5-325 MG tablet Take 1 tablet by  mouth 4 (four) times daily as needed.  [name] Kitchen ketoconazole (NIZORAL) 2 % cream Apply 1 application topically 2 (two) times daily.  . pantoprazole (PROTONIX) 40 MG tablet Take 1 tablet (40 mg total) by mouth daily before breakfast.  . progesterone (PROMETRIUM) 200 MG capsule Take 200 mg by mouth daily.  [name] Kitchen telmisartan-hydrochlorothiazide (MICARDIS HCT) 80-25 MG tablet TAKE 1 TABLET BY MOUTH ONCE A DAY     Allergies:   Penicillins   ROS:   Please see the history of present illness.    Review of Systems  Constitutional: Negative for chills and fever.  HENT: Negative for congestion and sore throat.   Respiratory: Negative for cough and shortness of breath.   Cardiovascular: Negative for chest pain and palpitations.  Genitourinary: Negative for dysuria and hematuria.  Psychiatric/Behavioral: Negative for depression and suicidal ideas. The patient is nervous/anxious.       Labs/Other Tests and Data Reviewed:    Recent Labs: 10/10/2019: ALT 16; Hemoglobin 12.1; Platelets 231 04/08/2020: BUN 21; Creat 1.10; Potassium 4.1; Sodium 136   Recent Lipid Panel Lab Results  Component Value Date/Time   CHOL 150 12/14/2018 12:00 AM   TRIG 125 12/14/2018 12:00 AM   HDL 35 12/14/2018 12:00 AM   LDLCALC 92 12/14/2018 12:00 AM    Wt Readings from Last 3 Encounters:  10/04/19 284 lb 1.3 oz (128.9 kg)  06/05/14 265 lb (120.2 kg)  05/04/12 230 lb (104.3 kg)      ASSESSMENT & PLAN:    Anxiety Prescribed Lexapro 10 mg QD Dose adjustment according to response  Elevated Creatinine S. Cr.: 1.1 Mild variation can be due to hydration status Not concerning for CKD according to last BMP Would check CMP again Stay hydrated increasing water fluid intake Avoid nephrotoxic agents if possible  Gout Elevated uric acid On Allopurinol  Time:   Today, I have spent 22 minutes reviewing the chart, including problem list, medications, and with the patient with telehealth technology discussing the above  problems.   Medication Adjustments/Labs and Tests Ordered: Current medicines are reviewed at length with the patient today.  Concerns regarding medicines are outlined above.   Tests Ordered: Orders Placed This Encounter  Procedures  . CBC with Differential  . Hemoglobin A1c  . Lipid panel  . TSH  . Vitamin D (25 hydroxy)  . COMPLETE METABOLIC PANEL WITH GFR    Medication Changes: Meds ordered this encounter  Medications  . escitalopram (LEXAPRO) 10 MG tablet    Sig: Take 1 tablet (10 mg total) by mouth daily.    Dispense:  30 tablet    Refill:  2     Note: This dictation was prepared with Dragon dictation along with smaller phrase technology. Similar sounding words can be transcribed inadequately or may not be corrected upon review. Any transcriptional errors that result from this process are unintentional.      Disposition:  Follow up  Signed, Lindell Spar, MD  06/12/2020 12:32 PM     [facility]

## 2020-06-19 ENCOUNTER — Ambulatory Visit
Admission: RE | Admit: 2020-06-19 | Discharge: 2020-06-19 | Disposition: A | Discharge status: Home / Self Care | Payer: 59 | Source: Ambulatory Visit | Attending: Obstetrics and Gynecology | Admitting: Obstetrics and Gynecology

## 2020-06-19 DIAGNOSIS — R9389 Abnormal findings on diagnostic imaging of other specified body structures: Secondary | ICD-10-CM | POA: Diagnosis not present

## 2020-06-19 DIAGNOSIS — N939 Abnormal uterine and vaginal bleeding, unspecified: Secondary | ICD-10-CM

## 2020-06-26 ENCOUNTER — Other Ambulatory Visit: Payer: Self-pay | Admitting: Podiatry

## 2020-06-27 ENCOUNTER — Other Ambulatory Visit: Payer: Self-pay | Admitting: Podiatry

## 2020-06-27 MED ORDER — METHYLPREDNISOLONE 4 MG PO TBPK: ORAL_TABLET | ORAL | 0 refills | Status: DC

## 2020-06-30 DIAGNOSIS — N950 Postmenopausal bleeding: Secondary | ICD-10-CM | POA: Diagnosis not present

## 2020-07-16 ENCOUNTER — Other Ambulatory Visit: Payer: Self-pay | Admitting: *Deleted

## 2020-07-16 DIAGNOSIS — Z6841 Body Mass Index (BMI) 40.0 and over, adult: Secondary | ICD-10-CM

## 2020-07-16 DIAGNOSIS — N951 Menopausal and female climacteric states: Secondary | ICD-10-CM

## 2020-07-16 DIAGNOSIS — M109 Gout, unspecified: Secondary | ICD-10-CM

## 2020-07-16 DIAGNOSIS — Z131 Encounter for screening for diabetes mellitus: Secondary | ICD-10-CM

## 2020-07-16 DIAGNOSIS — I10 Essential (primary) hypertension: Secondary | ICD-10-CM

## 2020-07-16 DIAGNOSIS — E559 Vitamin D deficiency, unspecified: Secondary | ICD-10-CM

## 2020-07-17 MED FILL — ESCITALOPRAM 10 MG TABLET: 10 | 30 days supply | Qty: 30 | Fill #1

## 2020-07-23 ENCOUNTER — Other Ambulatory Visit: Payer: Self-pay | Admitting: Obstetrics and Gynecology

## 2020-07-24 DIAGNOSIS — I10 Essential (primary) hypertension: Secondary | ICD-10-CM | POA: Diagnosis not present

## 2020-07-24 DIAGNOSIS — N951 Menopausal and female climacteric states: Secondary | ICD-10-CM | POA: Diagnosis not present

## 2020-07-24 DIAGNOSIS — E559 Vitamin D deficiency, unspecified: Secondary | ICD-10-CM | POA: Diagnosis not present

## 2020-07-24 DIAGNOSIS — Z131 Encounter for screening for diabetes mellitus: Secondary | ICD-10-CM | POA: Diagnosis not present

## 2020-07-24 DIAGNOSIS — M109 Gout, unspecified: Secondary | ICD-10-CM | POA: Diagnosis not present

## 2020-07-24 DIAGNOSIS — Z6841 Body Mass Index (BMI) 40.0 and over, adult: Secondary | ICD-10-CM | POA: Diagnosis not present

## 2020-07-25 LAB — CMP14+EGFR
ALT: 16 IU/L (ref 0–32)
AST: 14 IU/L (ref 0–40)
Albumin/Globulin Ratio: 1.7 (ref 1.2–2.2)
Albumin: 4.5 g/dL (ref 3.8–4.8)
Alkaline Phosphatase: 123 IU/L — ABNORMAL HIGH (ref 44–121)
BUN/Creatinine Ratio: 17 (ref 12–28)
BUN: 17 mg/dL (ref 8–27)
Bilirubin Total: 0.5 mg/dL (ref 0.0–1.2)
CO2: 22 mmol/L (ref 20–29)
Calcium: 9.6 mg/dL (ref 8.7–10.3)
Chloride: 101 mmol/L (ref 96–106)
Creatinine, Ser: 1.02 mg/dL — ABNORMAL HIGH (ref 0.57–1.00)
Globulin, Total: 2.6 g/dL (ref 1.5–4.5)
Glucose: 113 mg/dL — ABNORMAL HIGH (ref 65–99)
Potassium: 3.7 mmol/L (ref 3.5–5.2)
Sodium: 139 mmol/L (ref 134–144)
Total Protein: 7.1 g/dL (ref 6.0–8.5)
eGFR: 63 mL/min/{1.73_m2} (ref >59)

## 2020-07-25 LAB — LIPID PANEL
Chol/HDL Ratio: 4.6 ratio — ABNORMAL HIGH (ref 0.0–4.4)
Cholesterol, Total: 173 mg/dL (ref 100–199)
HDL: 38 mg/dL — ABNORMAL LOW (ref >39)
LDL Chol Calc (NIH): 105 mg/dL — ABNORMAL HIGH (ref 0–99)
Triglycerides: 168 mg/dL — ABNORMAL HIGH (ref 0–149)
VLDL Cholesterol Cal: 30 mg/dL (ref 5–40)

## 2020-07-25 LAB — TSH: TSH: 1.07 u[IU]/mL (ref 0.450–4.500)

## 2020-07-25 LAB — CBC
Hematocrit: 39.3 % (ref 34.0–46.6)
Hemoglobin: 13.2 g/dL (ref 11.1–15.9)
MCH: 29.7 pg (ref 26.6–33.0)
MCHC: 33.6 g/dL (ref 31.5–35.7)
MCV: 89 fL (ref 79–97)
Platelets: 228 10*3/uL (ref 150–450)
RBC: 4.44 x10E6/uL (ref 3.77–5.28)
RDW: 12.8 % (ref 11.7–15.4)
WBC: 7.1 10*3/uL (ref 3.4–10.8)

## 2020-07-25 LAB — HEMOGLOBIN A1C
Est. average glucose Bld gHb Est-mCnc: 117 mg/dL
Hgb A1c MFr Bld: 5.7 % — ABNORMAL HIGH (ref 4.8–5.6)

## 2020-07-25 LAB — VITAMIN D 25 HYDROXY (VIT D DEFICIENCY, FRACTURES): Vit D, 25-Hydroxy: 21.5 ng/mL — ABNORMAL LOW (ref 30.0–100.0)

## 2020-07-25 LAB — URIC ACID: Uric Acid: 8.5 mg/dL — ABNORMAL HIGH (ref 3.0–7.2)

## 2020-07-27 ENCOUNTER — Other Ambulatory Visit: Payer: Self-pay | Admitting: Internal Medicine

## 2020-07-27 DIAGNOSIS — E559 Vitamin D deficiency, unspecified: Secondary | ICD-10-CM

## 2020-07-27 DIAGNOSIS — M109 Gout, unspecified: Secondary | ICD-10-CM

## 2020-07-27 MED ORDER — ALLOPURINOL 100 MG PO TABS: 100.0000 mg | ORAL_TABLET | Freq: Two times a day (BID) | ORAL | 5 refills | Status: DC

## 2020-07-27 MED ORDER — VITAMIN D (ERGOCALCIFEROL) 1.25 MG (50000 UNIT) PO CAPS
50000.0000 [IU] | ORAL_CAPSULE | ORAL | 5 refills | Status: DC
Start: 2020-07-27 — End: 2020-07-27

## 2020-08-05 ENCOUNTER — Other Ambulatory Visit (HOSPITAL_BASED_OUTPATIENT_CLINIC_OR_DEPARTMENT_OTHER): Payer: Self-pay

## 2020-08-06 DIAGNOSIS — R9389 Abnormal findings on diagnostic imaging of other specified body structures: Secondary | ICD-10-CM | POA: Diagnosis not present

## 2020-08-06 DIAGNOSIS — N950 Postmenopausal bleeding: Secondary | ICD-10-CM | POA: Diagnosis not present

## 2020-08-06 DIAGNOSIS — N840 Polyp of corpus uteri: Secondary | ICD-10-CM | POA: Diagnosis not present

## 2020-08-07 ENCOUNTER — Other Ambulatory Visit (HOSPITAL_BASED_OUTPATIENT_CLINIC_OR_DEPARTMENT_OTHER): Payer: Self-pay

## 2020-08-12 ENCOUNTER — Other Ambulatory Visit: Payer: Self-pay

## 2020-08-12 ENCOUNTER — Encounter (HOSPITAL_BASED_OUTPATIENT_CLINIC_OR_DEPARTMENT_OTHER): Payer: Self-pay | Admitting: Obstetrics and Gynecology

## 2020-08-12 NOTE — H&P (Signed)
60 y.o. G2P2 complains of continued postmenopausal bleeding and thickened endometrium for several years.  Past Medical History:  Diagnosis Date  . Allergic rhinitis due to pollen   . Anxiety   . Arthritis    oa lumbar region  . Breast cyst, right 05/23/2019  . Chronic back pain   . Essential (primary) hypertension   . GERD (gastroesophageal reflux disease)   . Gout last flare up jan 2022  . Headache   . Heart murmur    told years ago had mild murmur none heard recently per pt  . History of hiatal hernia   . Hypertension   . IBS (irritable bowel syndrome)    ibsd per pt  . Low back pain   . Obesity, unspecified   . PMB (postmenopausal bleeding)   . PONV (postoperative nausea and vomiting)    slower to awkaen after cholecystectomy  . Seasonal allergies   . Skin irritation    both breats where bra rubs healing well using fluticasone prn per pt  . Toenail fungus    left great toe using compounded med for  . Wears contact lenses   . Wears glasses   . Zoster without complications    Past Surgical History:  Procedure Laterality Date  . BALLOON DILATION  05/04/2012   Procedure: BALLOON DILATION;  Surgeon: Renee Houston, MD;  [facility];  Service: Endoscopy;  Laterality: N/A;  . [place]  . CHOLECYSTECTOMY  yrs ago   laparoscopic  . COLONOSCOPY WITH ESOPHAGOGASTRODUODENOSCOPY (EGD)  05/04/2012   Procedure: COLONOSCOPY WITH ESOPHAGOGASTRODUODENOSCOPY (EGD);  Surgeon: Renee Houston, MD;  [facility];  Service: Endoscopy;  Laterality: N/A;  730  . Cyst removed from right finger  yrs ago  . Expoloratory Lap  [id number]  . MALONEY DILATION  05/04/2012   Procedure: MALONEY DILATION;  Surgeon: Renee Houston, MD;  [facility];  Service: Endoscopy;  Laterality: N/A;  . SAVORY DILATION  05/04/2012   Procedure: SAVORY DILATION;  Surgeon: Renee Houston, MD;  [facility];  Service: Endoscopy;  Laterality: N/A;    Social  History   Socioeconomic History  . Marital status: Married    Spouse name: Not on file  . Number of children: Not on file  . Years of education: Not on file  . Highest education level: Not on file  Occupational History  . Not on file  Tobacco Use  . Smoking status: Never Smoker  . Smokeless tobacco: Never Used  Vaping Use  . Vaping Use: Never used  Substance and Sexual Activity  . Alcohol use: No    Alcohol/week: 0.0 standard drinks  . Drug use: No  . Sexual activity: Yes    Birth control/protection: None  Other Topics Concern  . Not on file  Social History Narrative  . Not on file   Social Determinants of Health   Financial Resource Strain: Not on file  Food Insecurity: Not on file  Transportation Needs: Not on file  Physical Activity: Not on file  Stress: Not on file  Social Connections: Not on file  Intimate Partner Violence: Not on file    No current facility-administered medications on file prior to encounter.   Current Outpatient Medications on File Prior to Encounter  Medication Sig Dispense Refill  . acetaminophen (TYLENOL) 500 MG tablet Take 1,000 mg by mouth every 6 (six) hours as needed.    . fluticasone (CUTIVATE) 0.05 % cream Apply  topically 2 (two) times daily as needed. 2 % per pt    . ibuprofen (ADVIL) 200 MG tablet Take 200 mg by mouth every 6 (six) hours as needed.    [name] Kitchen levocetirizine (XYZAL) 5 MG tablet Take 10 mg by mouth daily. Takes 2 of 10 mg in am per pt    . UNABLE TO FIND Compounded formulary with terb 3 % fluc 2% teatree 5 % urea 10 % ib42 apply to left great toe 1 to 2 x daily    . escitalopram (LEXAPRO) 10 MG tablet Take 1 tablet (10 mg total) by mouth daily. 30 tablet 2  . ketoconazole (NIZORAL) 2 % cream Apply 1 application topically 2 (two) times daily. (Patient taking differently: Apply 1 application topically 2 (two) times daily as needed.) 30 g 1  . pantoprazole (PROTONIX) 40 MG tablet Take 1 tablet (40 mg total) by mouth daily before  breakfast. (Patient taking differently: Take 40 mg by mouth daily as needed.) 90 tablet 3  . progesterone (PROMETRIUM) 200 MG capsule Take 200 mg by mouth at bedtime.    [name] Kitchen telmisartan-hydrochlorothiazide (MICARDIS HCT) 80-25 MG tablet TAKE 1 TABLET BY MOUTH ONCE A DAY 90 tablet 0    Allergies  Allergen Reactions  . Penicillins Rash    Severe rash    Vitals:   08/12/20 0901  Weight: 108.9 kg  Height: 5\' 7"  (1.702 m)    Lungs: clear to ascultation Cor:  RRR Abdomen:  soft, nontender, nondistended. Ex:  no cords, erythema Pelvic:  Nefg, normal size uterus, no masses felt  EMB done in office showed benign endo polyp only.  A:  Persistent PMP with thickened endometrium.  All studies have been benign but she continues to need dx studies for the bleeding and thiickening.  For definitive therapy and diagnosis with robotic TLH, cysto.  Pt desires BSO.     P: P: All risks, benefits and alternatives d/w patient and she desires to proceed.  Patient has undergone a modified diet, ERAS protocol and will receive preop antibiotics and SCDs during the operation.   Pt to have extended recovery but will go home same day if eating, ambulating, voiding and pain control is good.  Renee Alvarez

## 2020-08-12 NOTE — Progress Notes (Signed)
Spoke w/ via phone for pre-op interview---pt Lab needs dos----none               Lab results------has lab appt 08-14-2020 1300 pm for cbc bmp t & s ekg COVID test ------08-14-2020 1400 Arrive at -------530 am 08-17-2020 NPO after MN NO Solid Food.  Clear liquids from MN until---430 am then npo Med rec completed Medications to take morning of surgery -----allopurinol, escitalopram, pantaprazole xyzal Diabetic medication -----n/a Patient instructed to bring photo id and insurance card day of surgery Patient aware to have Driver (ride ) / caregiver    for 24 hours after surgery  Patient Special Instructions ----- spouse Renee Alvarez Pre-Op special Istructions -----pt given extended recovery instructions Patient verbalized understanding of instructions that were given at this phone interview. Patient denies shortness of breath, chest pain, fever, cough at this phone interview.

## 2020-08-12 NOTE — Progress Notes (Signed)
YOU ARE SCHEDULED FOR A COVID TEST 08-14-2020@ 200 PM.  THIS TEST MUST BE DONE BEFORE SURGERY. GO TO  [facility]. [place], [place], IT IS APPROXIMATELY 2 MINUTES PAST [employer] ON THE RIGHT AND REMAIN IN YOUR CAR, THIS IS A DRIVE UP TEST. ONCE YOUR COVID TEST IS DONE PLEASE FOLLOW ALL THE QUARANTINE  INSTRUCTIONS GIVEN IN YOUR HANDOUT.      Your procedure is scheduled on 08-17-2020  Report to [facility].   Call this number if you have problems the morning of surgery  [phone].   OUR ADDRESS IS [place].  WE ARE LOCATED IN [facility].  PLEASE BRING YOUR INSURANCE CARD AND PHOTO ID DAY OF SURGERY.  ONLY ONE PERSON ALLOWED IN FACILITY WAITING AREA.                                     REMEMBER:  DO NOT EAT FOOD, CANDY GUM OR MINTS  AFTER MIDNIGHT . YOU MAY HAVE CLEAR LIQUIDS FROM MIDNIGHT UNTIL 430 AM. NO CLEAR LIQUIDS AFTER  430 AM DAY OF SURGERY.   YOU MAY  BRUSH YOUR TEETH MORNING OF SURGERY AND RINSE YOUR MOUTH OUT, NO CHEWING GUM CANDY OR MINTS.    CLEAR LIQUID DIET   Foods Allowed                                                                     Foods Excluded  Coffee and tea, regular and decaf                             liquids that you cannot  Plain Jell-O any favor except red or purple                                           see through such as: Fruit ices (not with fruit pulp)                                     milk, soups, orange juice  Iced Popsicles                                    All solid food Carbonated beverages, regular and diet                                    Cranberry, grape and apple juices Sports drinks like Gatorade Lightly seasoned clear broth or consume(fat free) Sugar, honey syrup  Sample Menu Breakfast                                Lunch  Supper Cranberry juice                    Beef broth                            Chicken  broth Jell-O                                     Grape juice                           Apple juice Coffee or tea                        Jell-O                                      Popsicle                                                Coffee or tea                        Coffee or tea  _____________________________________________________________________     TAKE THESE MEDICATIONS MORNING OF SURGERY WITH A SIP OF WATER:  ALLOPURINOL, ESCITALOPRAM, PANTAPRAZOLE XYZAL.  ONE VISITOR IS ALLOWED IN WAITING ROOM ONLY DAY OF SURGERY.  NO VISITOR MAY SPEND THE NIGHT.  VISITOR ARE ALLOWED TO STAY UNTIL 800 PM.                                    DO NOT WEAR JEWERLY, MAKE UP. DO NOT WEAR LOTIONS, POWDERS, PERFUMES OR DEODORANT. DO NOT SHAVE FOR 24 HOURS PRIOR TO DAY OF SURGERY. MEN MAY SHAVE FACE AND NECK. CONTACTS, GLASSES, OR DENTURES MAY NOT BE WORN TO SURGERY.                                    [facility] IS NOT RESPONSIBLE  FOR ANY BELONGINGS.                                                                    [name] Kitchen           [facility] - Preparing for Surgery Before surgery, you can play an important role.  Because skin is not sterile, your skin needs to be as free of germs as possible.  You can reduce the number of germs on your skin by washing with CHG (chlorahexidine gluconate) soap before surgery.  CHG is an antiseptic cleaner which kills germs and bonds with the skin to continue killing germs even after washing. Please DO NOT use if you have an allergy to CHG or antibacterial soaps.  If your skin  becomes reddened/irritated stop using the CHG and inform your nurse when you arrive at Short Stay. Do not shave (including legs and underarms) for at least 48 hours prior to the first CHG shower.  You may shave your face/neck. Please follow these instructions carefully:  1.  Shower with CHG Soap the night before surgery and the  morning of Surgery.  2.  If you choose to wash your hair, wash your hair  first as usual with your  normal  shampoo.  3.  After you shampoo, rinse your hair and body thoroughly to remove the  shampoo.                            4.  Use CHG as you would any other liquid soap.  You can apply chg directly  to the skin and wash                      Gently with a scrungie or clean washcloth.  5.  Apply the CHG Soap to your body ONLY FROM THE NECK DOWN.   Do not use on face/ open                           Wound or open sores. Avoid contact with eyes, ears mouth and genitals (private parts).                       Wash face,  Genitals (private parts) with your normal soap.             6.  Wash thoroughly, paying special attention to the area where your surgery  will be performed.  7.  Thoroughly rinse your body with warm water from the neck down.  8.  DO NOT shower/wash with your normal soap after using and rinsing off  the CHG Soap.                9.  Pat yourself dry with a clean towel.            10.  Wear clean pajamas.            11.  Place clean sheets on your bed the night of your first shower and do not  sleep with pets. Day of Surgery : Do not apply any lotions/deodorants the morning of surgery.  Please wear clean clothes to the hospital/surgery center.  FAILURE TO FOLLOW THESE INSTRUCTIONS MAY RESULT IN THE CANCELLATION OF YOUR SURGERY PATIENT SIGNATURE_________________________________  NURSE SIGNATURE__________________________________  ________________________________________________________________________                                                        QUESTIONS Hansel Feinstein PRE OP NURSE PHONE [phone]

## 2020-08-14 ENCOUNTER — Other Ambulatory Visit: Payer: Self-pay

## 2020-08-14 ENCOUNTER — Encounter (HOSPITAL_COMMUNITY)
Admission: RE | Admit: 2020-08-14 | Discharge: 2020-08-14 | Disposition: A | Discharge status: Home / Self Care | Payer: 59 | Source: Ambulatory Visit | Attending: Obstetrics and Gynecology | Admitting: Obstetrics and Gynecology

## 2020-08-14 ENCOUNTER — Other Ambulatory Visit (HOSPITAL_COMMUNITY)
Admission: RE | Admit: 2020-08-14 | Discharge: 2020-08-14 | Disposition: A | Discharge status: Home / Self Care | Payer: 59 | Source: Ambulatory Visit | Attending: Obstetrics and Gynecology | Admitting: Obstetrics and Gynecology

## 2020-08-14 DIAGNOSIS — Z20822 Contact with and (suspected) exposure to covid-19: Secondary | ICD-10-CM | POA: Insufficient documentation

## 2020-08-14 DIAGNOSIS — Z01818 Encounter for other preprocedural examination: Secondary | ICD-10-CM | POA: Diagnosis not present

## 2020-08-14 LAB — CBC
HCT: 40.9 % (ref 36.0–46.0)
Hemoglobin: 13.5 g/dL (ref 12.0–15.0)
MCH: 29.9 pg (ref 26.0–34.0)
MCHC: 33 g/dL (ref 30.0–36.0)
MCV: 90.7 fL (ref 80.0–100.0)
Platelets: 264 10*3/uL (ref 150–400)
RBC: 4.51 MIL/uL (ref 3.87–5.11)
RDW: 13.3 % (ref 11.5–15.5)
WBC: 8.3 10*3/uL (ref 4.0–10.5)
nRBC: 0 % (ref 0.0–0.2)

## 2020-08-14 LAB — BASIC METABOLIC PANEL
Anion gap: 9 (ref 5–15)
BUN: 17 mg/dL (ref 8–23)
CO2: 25 mmol/L (ref 22–32)
Calcium: 9.4 mg/dL (ref 8.9–10.3)
Chloride: 103 mmol/L (ref 98–111)
Creatinine, Ser: 0.96 mg/dL (ref 0.44–1.00)
GFR, Estimated: >60 mL/min (ref >60)
Glucose, Bld: 93 mg/dL (ref 70–99)
Potassium: 3.4 mmol/L — ABNORMAL LOW (ref 3.5–5.1)
Sodium: 137 mmol/L (ref 135–145)

## 2020-08-15 LAB — SARS CORONAVIRUS 2 (TAT 6-24 HRS): SARS Coronavirus 2: NEGATIVE

## 2020-08-16 NOTE — Anesthesia Preprocedure Evaluation (Addendum)
Anesthesia Evaluation  Patient identified by MRN, date of birth, ID band Patient awake    Reviewed: Allergy & Precautions, H&P , NPO status , Patient's Chart, lab work & pertinent test results  History of Anesthesia Complications (+) PONV, PROLONGED EMERGENCE and history of anesthetic complications  Airway Mallampati: I  TM Distance: >3 FB Neck ROM: Full    Dental no notable dental hx. (+) Teeth Intact, Dental Advisory Given   Pulmonary neg pulmonary ROS,    Pulmonary exam normal breath sounds clear to auscultation       Cardiovascular Exercise Tolerance: Good hypertension, Pt. on medications Normal cardiovascular exam Rhythm:Regular Rate:Normal     Neuro/Psych  Headaches, Anxiety    GI/Hepatic Neg liver ROS, hiatal hernia, GERD  Medicated and Controlled,  Endo/Other  Morbid obesity  Renal/GU negative Renal ROS  negative genitourinary   Musculoskeletal  (+) Arthritis ,   Abdominal (+) + obese,   Peds negative pediatric ROS (+)  Hematology negative hematology ROS (+)   Anesthesia Other Findings   Reproductive/Obstetrics negative OB ROS                            Anesthesia Physical Anesthesia Plan  ASA: III  Anesthesia Plan: General   Post-op Pain Management:    Induction: Intravenous  PONV Risk Score and Plan: 3 and Ondansetron, Dexamethasone, Midazolam, Scopolamine patch - Pre-op and Propofol infusion  Airway Management Planned: Oral ETT  Additional Equipment: None  Intra-op Plan:   Post-operative Plan: Extubation in OR  Informed Consent: I have reviewed the patients History and Physical, chart, labs and discussed the procedure including the risks, benefits and alternatives for the proposed anesthesia with the patient or authorized representative who has indicated his/her understanding and acceptance.       Plan Discussed with: Anesthesiologist and CRNA  Anesthesia  Plan Comments: (  )       Anesthesia Quick Evaluation

## 2020-08-17 ENCOUNTER — Ambulatory Visit (HOSPITAL_BASED_OUTPATIENT_CLINIC_OR_DEPARTMENT_OTHER): Payer: 59 | Admitting: Anesthesiology

## 2020-08-17 ENCOUNTER — Encounter (HOSPITAL_BASED_OUTPATIENT_CLINIC_OR_DEPARTMENT_OTHER): Payer: Self-pay | Admitting: Obstetrics and Gynecology

## 2020-08-17 ENCOUNTER — Other Ambulatory Visit (HOSPITAL_COMMUNITY): Payer: Self-pay

## 2020-08-17 ENCOUNTER — Ambulatory Visit (HOSPITAL_BASED_OUTPATIENT_CLINIC_OR_DEPARTMENT_OTHER)
Admission: RE | Admit: 2020-08-17 | Discharge: 2020-08-17 | Disposition: A | Discharge status: Home / Self Care | Payer: 59 | Attending: Obstetrics and Gynecology | Admitting: Obstetrics and Gynecology

## 2020-08-17 ENCOUNTER — Encounter (HOSPITAL_BASED_OUTPATIENT_CLINIC_OR_DEPARTMENT_OTHER)
Admission: RE | Disposition: A | Discharge status: Home / Self Care | Payer: Self-pay | Source: Home / Self Care | Attending: Obstetrics and Gynecology

## 2020-08-17 DIAGNOSIS — Z9889 Other specified postprocedural states: Secondary | ICD-10-CM

## 2020-08-17 DIAGNOSIS — Z880 Allergy status to penicillin: Secondary | ICD-10-CM | POA: Diagnosis not present

## 2020-08-17 DIAGNOSIS — N840 Polyp of corpus uteri: Secondary | ICD-10-CM | POA: Diagnosis not present

## 2020-08-17 DIAGNOSIS — J301 Allergic rhinitis due to pollen: Secondary | ICD-10-CM | POA: Diagnosis not present

## 2020-08-17 DIAGNOSIS — N950 Postmenopausal bleeding: Secondary | ICD-10-CM | POA: Insufficient documentation

## 2020-08-17 DIAGNOSIS — N800 Endometriosis of uterus: Secondary | ICD-10-CM | POA: Insufficient documentation

## 2020-08-17 DIAGNOSIS — I10 Essential (primary) hypertension: Secondary | ICD-10-CM | POA: Diagnosis not present

## 2020-08-17 DIAGNOSIS — Z79899 Other long term (current) drug therapy: Secondary | ICD-10-CM | POA: Insufficient documentation

## 2020-08-17 DIAGNOSIS — D259 Leiomyoma of uterus, unspecified: Secondary | ICD-10-CM | POA: Insufficient documentation

## 2020-08-17 DIAGNOSIS — K219 Gastro-esophageal reflux disease without esophagitis: Secondary | ICD-10-CM | POA: Diagnosis not present

## 2020-08-17 DIAGNOSIS — D251 Intramural leiomyoma of uterus: Secondary | ICD-10-CM | POA: Diagnosis not present

## 2020-08-17 HISTORY — DX: Headache, unspecified: R51.9

## 2020-08-17 HISTORY — DX: Unspecified osteoarthritis, unspecified site: M19.90

## 2020-08-17 HISTORY — PX: ROBOTIC ASSISTED LAPAROSCOPIC HYSTERECTOMY AND SALPINGECTOMY: SHX6379

## 2020-08-17 HISTORY — PX: CYSTOSCOPY: SHX5120

## 2020-08-17 HISTORY — DX: Gout, unspecified: M10.9

## 2020-08-17 HISTORY — DX: Irritable bowel syndrome, unspecified: K58.9

## 2020-08-17 HISTORY — DX: Anxiety disorder, unspecified: F41.9

## 2020-08-17 HISTORY — DX: Presence of spectacles and contact lenses: Z97.3

## 2020-08-17 HISTORY — DX: Personal history of other diseases of the digestive system: Z87.19

## 2020-08-17 HISTORY — DX: Cardiac murmur, unspecified: R01.1

## 2020-08-17 HISTORY — DX: Tinea unguium: B35.1

## 2020-08-17 HISTORY — DX: Other skin changes: R23.8

## 2020-08-17 HISTORY — DX: Postmenopausal bleeding: N95.0

## 2020-08-17 LAB — ABO/RH: ABO/RH(D): O NEG

## 2020-08-17 LAB — TYPE AND SCREEN
ABO/RH(D): O NEG
Antibody Screen: NEGATIVE

## 2020-08-17 SURGERY — XI ROBOTIC ASSISTED LAPAROSCOPIC HYSTERECTOMY AND SALPINGECTOMY
Anesthesia: General | Site: Urethra | Laterality: Bilateral

## 2020-08-17 MED ORDER — PANTOPRAZOLE SODIUM 40 MG PO TBEC
DELAYED_RELEASE_TABLET | ORAL | Status: AC
  Filled 2020-08-17: qty 1

## 2020-08-17 MED ORDER — PROGESTERONE 200 MG PO CAPS: 200.0000 mg | ORAL_CAPSULE | Freq: Every day | ORAL | Status: DC

## 2020-08-17 MED ORDER — LIDOCAINE HCL (CARDIAC) PF 100 MG/5ML IV SOSY
PREFILLED_SYRINGE | INTRAVENOUS | Status: DC | PRN
  Administered 2020-08-17: 100 mg via INTRAVENOUS

## 2020-08-17 MED ORDER — OXYCODONE HCL 5 MG PO TABS
5.0000 mg | ORAL_TABLET | Freq: Once | ORAL | Status: AC | PRN
  Administered 2020-08-17: 5 mg via ORAL

## 2020-08-17 MED ORDER — OXYCODONE HCL 5 MG/5ML PO SOLN: 5.0000 mg | Freq: Once | ORAL | Status: AC | PRN

## 2020-08-17 MED ORDER — LACTATED RINGERS IV SOLN: INTRAVENOUS | Status: DC

## 2020-08-17 MED ORDER — SCOPOLAMINE 1 MG/3DAYS TD PT72
MEDICATED_PATCH | TRANSDERMAL | Status: AC
  Filled 2020-08-17: qty 1

## 2020-08-17 MED ORDER — PROPOFOL 500 MG/50ML IV EMUL
INTRAVENOUS | Status: AC
  Filled 2020-08-17: qty 50

## 2020-08-17 MED ORDER — ROCURONIUM BROMIDE 10 MG/ML (PF) SYRINGE
PREFILLED_SYRINGE | INTRAVENOUS | Status: AC
  Filled 2020-08-17: qty 10

## 2020-08-17 MED ORDER — POVIDONE-IODINE 10 % EX SWAB
2.0000 "application " | Freq: Once | CUTANEOUS | Status: AC
  Administered 2020-08-17: 2 via TOPICAL

## 2020-08-17 MED ORDER — MIDAZOLAM HCL 2 MG/2ML IJ SOLN
INTRAMUSCULAR | Status: AC
  Filled 2020-08-17: qty 2

## 2020-08-17 MED ORDER — GABAPENTIN 300 MG PO CAPS
300.0000 mg | ORAL_CAPSULE | ORAL | Status: AC
  Administered 2020-08-17: 300 mg via ORAL

## 2020-08-17 MED ORDER — SODIUM CHLORIDE 0.9 % IV SOLN
INTRAVENOUS | Status: DC | PRN
  Administered 2020-08-17: 60 mL

## 2020-08-17 MED ORDER — ACETAMINOPHEN 325 MG PO TABS: 325.0000 mg | ORAL_TABLET | ORAL | Status: DC | PRN

## 2020-08-17 MED ORDER — CLINDAMYCIN PHOSPHATE 900 MG/50ML IV SOLN
INTRAVENOUS | Status: AC
  Filled 2020-08-17: qty 50

## 2020-08-17 MED ORDER — TELMISARTAN-HCTZ 80-25 MG PO TABS: 1.0000 | ORAL_TABLET | Freq: Every day | ORAL | Status: DC

## 2020-08-17 MED ORDER — KETOROLAC TROMETHAMINE 30 MG/ML IJ SOLN
INTRAMUSCULAR | Status: AC
  Filled 2020-08-17: qty 1

## 2020-08-17 MED ORDER — SOD CITRATE-CITRIC ACID 500-334 MG/5ML PO SOLN: 30.0000 mL | ORAL | Status: DC

## 2020-08-17 MED ORDER — SCOPOLAMINE 1 MG/3DAYS TD PT72
1.0000 | MEDICATED_PATCH | Freq: Once | TRANSDERMAL | Status: DC
  Administered 2020-08-17: 1.5 mg via TRANSDERMAL

## 2020-08-17 MED ORDER — CIPROFLOXACIN IN D5W 400 MG/200ML IV SOLN
INTRAVENOUS | Status: AC
  Filled 2020-08-17: qty 200

## 2020-08-17 MED ORDER — CELECOXIB 200 MG PO CAPS
400.0000 mg | ORAL_CAPSULE | ORAL | Status: AC
  Administered 2020-08-17: 400 mg via ORAL

## 2020-08-17 MED ORDER — KETOROLAC TROMETHAMINE 15 MG/ML IJ SOLN: 15.0000 mg | INTRAMUSCULAR | Status: DC

## 2020-08-17 MED ORDER — ONDANSETRON HCL 4 MG/2ML IJ SOLN
INTRAMUSCULAR | Status: DC | PRN
  Administered 2020-08-17: 4 mg via INTRAVENOUS

## 2020-08-17 MED ORDER — FUROSEMIDE 10 MG/ML IJ SOLN
INTRAMUSCULAR | Status: DC | PRN
  Administered 2020-08-17: 5 mg via INTRAMUSCULAR

## 2020-08-17 MED ORDER — DEXAMETHASONE SODIUM PHOSPHATE 4 MG/ML IJ SOLN
INTRAMUSCULAR | Status: DC | PRN
  Administered 2020-08-17: 10 mg via INTRAVENOUS

## 2020-08-17 MED ORDER — OXYCODONE-ACETAMINOPHEN 5-325 MG PO TABS
ORAL_TABLET | ORAL | Status: AC
  Filled 2020-08-17: qty 1

## 2020-08-17 MED ORDER — INDIGOTINDISULFONATE SODIUM 8 MG/ML IJ SOLN
INTRAMUSCULAR | Status: DC | PRN
  Administered 2020-08-17: 5 mL via INTRAVENOUS

## 2020-08-17 MED ORDER — ONDANSETRON HCL 4 MG PO TABS: 4.0000 mg | ORAL_TABLET | Freq: Four times a day (QID) | ORAL | Status: DC | PRN

## 2020-08-17 MED ORDER — ACETAMINOPHEN 500 MG PO TABS
ORAL_TABLET | ORAL | Status: AC
  Filled 2020-08-17: qty 2

## 2020-08-17 MED ORDER — MEPERIDINE HCL 25 MG/ML IJ SOLN: 6.2500 mg | INTRAMUSCULAR | Status: DC | PRN

## 2020-08-17 MED ORDER — MENTHOL 3 MG MT LOZG: 1.0000 | LOZENGE | OROMUCOSAL | Status: DC | PRN

## 2020-08-17 MED ORDER — FENTANYL CITRATE (PF) 250 MCG/5ML IJ SOLN
INTRAMUSCULAR | Status: AC
  Filled 2020-08-17: qty 5

## 2020-08-17 MED ORDER — FUROSEMIDE 10 MG/ML IJ SOLN
INTRAMUSCULAR | Status: AC
  Filled 2020-08-17: qty 2

## 2020-08-17 MED ORDER — PROPOFOL 10 MG/ML IV BOLUS
INTRAVENOUS | Status: DC | PRN
  Administered 2020-08-17: 160 mg via INTRAVENOUS
  Administered 2020-08-17: 40 mg via INTRAVENOUS

## 2020-08-17 MED ORDER — FENTANYL CITRATE (PF) 100 MCG/2ML IJ SOLN
INTRAMUSCULAR | Status: DC | PRN
  Administered 2020-08-17: 100 ug via INTRAVENOUS
  Administered 2020-08-17 (×3): 50 ug via INTRAVENOUS

## 2020-08-17 MED ORDER — HYDROCHLOROTHIAZIDE 25 MG PO TABS
25.0000 mg | ORAL_TABLET | Freq: Every day | ORAL | Status: DC
  Administered 2020-08-17: 25 mg via ORAL
  Filled 2020-08-17: qty 1

## 2020-08-17 MED ORDER — MIDAZOLAM HCL 5 MG/5ML IJ SOLN
INTRAMUSCULAR | Status: DC | PRN
  Administered 2020-08-17: 2 mg via INTRAVENOUS

## 2020-08-17 MED ORDER — PROPOFOL 500 MG/50ML IV EMUL
INTRAVENOUS | Status: DC | PRN
  Administered 2020-08-17: 100 ug/kg/min via INTRAVENOUS

## 2020-08-17 MED ORDER — IBUPROFEN 800 MG PO TABS: 800.0000 mg | ORAL_TABLET | Freq: Three times a day (TID) | ORAL | Status: DC

## 2020-08-17 MED ORDER — ONDANSETRON HCL 4 MG/2ML IJ SOLN: 4.0000 mg | Freq: Once | INTRAMUSCULAR | Status: DC | PRN

## 2020-08-17 MED ORDER — EPHEDRINE SULFATE 50 MG/ML IJ SOLN
INTRAMUSCULAR | Status: DC | PRN
  Administered 2020-08-17: 20 mg via INTRAVENOUS

## 2020-08-17 MED ORDER — CLINDAMYCIN PHOSPHATE 900 MG/50ML IV SOLN
900.0000 mg | INTRAVENOUS | Status: AC
  Administered 2020-08-17: 900 mg via INTRAVENOUS

## 2020-08-17 MED ORDER — OXYCODONE-ACETAMINOPHEN 5-325 MG PO TABS
1.0000 | ORAL_TABLET | ORAL | 0 refills | Status: DC | PRN
  Filled 2020-08-17: qty 30, 5d supply, fill #0

## 2020-08-17 MED ORDER — ROCURONIUM BROMIDE 100 MG/10ML IV SOLN
INTRAVENOUS | Status: DC | PRN
  Administered 2020-08-17: 20 mg via INTRAVENOUS
  Administered 2020-08-17: 60 mg via INTRAVENOUS
  Administered 2020-08-17: 10 mg via INTRAVENOUS

## 2020-08-17 MED ORDER — ONDANSETRON HCL 4 MG/2ML IJ SOLN
INTRAMUSCULAR | Status: AC
  Filled 2020-08-17: qty 2

## 2020-08-17 MED ORDER — SUGAMMADEX SODIUM 200 MG/2ML IV SOLN
INTRAVENOUS | Status: DC | PRN
  Administered 2020-08-17: 200 mg via INTRAVENOUS

## 2020-08-17 MED ORDER — DOCUSATE SODIUM 100 MG PO CAPS
100.0000 mg | ORAL_CAPSULE | Freq: Two times a day (BID) | ORAL | Status: DC
  Administered 2020-08-17: 100 mg via ORAL

## 2020-08-17 MED ORDER — ACETAMINOPHEN 500 MG PO TABS
1000.0000 mg | ORAL_TABLET | ORAL | Status: AC
  Administered 2020-08-17: 1000 mg via ORAL

## 2020-08-17 MED ORDER — LEVOCETIRIZINE DIHYDROCHLORIDE 5 MG PO TABS
10.0000 mg | ORAL_TABLET | Freq: Every day | ORAL | Status: DC
  Filled 2020-08-17: qty 2

## 2020-08-17 MED ORDER — SODIUM CHLORIDE 0.9 % IR SOLN
Status: DC | PRN
  Administered 2020-08-17: 1000 mL

## 2020-08-17 MED ORDER — KETOCONAZOLE 2 % EX CREA: 1.0000 "application " | TOPICAL_CREAM | Freq: Two times a day (BID) | CUTANEOUS | Status: DC

## 2020-08-17 MED ORDER — PROPOFOL 10 MG/ML IV BOLUS
INTRAVENOUS | Status: AC
  Filled 2020-08-17: qty 20

## 2020-08-17 MED ORDER — OXYCODONE HCL 5 MG PO TABS
ORAL_TABLET | ORAL | Status: AC
  Filled 2020-08-17: qty 1

## 2020-08-17 MED ORDER — DOCUSATE SODIUM 100 MG PO CAPS
ORAL_CAPSULE | ORAL | Status: AC
  Filled 2020-08-17: qty 1

## 2020-08-17 MED ORDER — OXYCODONE-ACETAMINOPHEN 5-325 MG PO TABS
1.0000 | ORAL_TABLET | ORAL | Status: DC | PRN
  Administered 2020-08-17: 1 via ORAL

## 2020-08-17 MED ORDER — KETOROLAC TROMETHAMINE 30 MG/ML IJ SOLN
30.0000 mg | Freq: Four times a day (QID) | INTRAMUSCULAR | Status: DC
  Administered 2020-08-17 (×2): 30 mg via INTRAVENOUS

## 2020-08-17 MED ORDER — ESCITALOPRAM OXALATE 10 MG PO TABS
10.0000 mg | ORAL_TABLET | Freq: Every day | ORAL | Status: DC
  Filled 2020-08-17: qty 1

## 2020-08-17 MED ORDER — GABAPENTIN 300 MG PO CAPS
ORAL_CAPSULE | ORAL | Status: AC
  Filled 2020-08-17: qty 1

## 2020-08-17 MED ORDER — ONDANSETRON HCL 4 MG/2ML IJ SOLN: 4.0000 mg | Freq: Four times a day (QID) | INTRAMUSCULAR | Status: DC | PRN

## 2020-08-17 MED ORDER — DEXAMETHASONE SODIUM PHOSPHATE 10 MG/ML IJ SOLN
INTRAMUSCULAR | Status: AC
  Filled 2020-08-17: qty 1

## 2020-08-17 MED ORDER — CIPROFLOXACIN IN D5W 400 MG/200ML IV SOLN
400.0000 mg | INTRAVENOUS | Status: AC
  Administered 2020-08-17: 400 mg via INTRAVENOUS

## 2020-08-17 MED ORDER — KETOCONAZOLE 2 % EX CREA
1.0000 "application " | TOPICAL_CREAM | Freq: Two times a day (BID) | CUTANEOUS | 1 refills | Status: DC
  Filled 2020-08-17: qty 30, 15d supply, fill #0

## 2020-08-17 MED ORDER — IRBESARTAN 300 MG PO TABS
300.0000 mg | ORAL_TABLET | Freq: Every day | ORAL | Status: DC
  Administered 2020-08-17: 300 mg via ORAL
  Filled 2020-08-17: qty 1

## 2020-08-17 MED ORDER — LIDOCAINE 2% (20 MG/ML) 5 ML SYRINGE
INTRAMUSCULAR | Status: AC
  Filled 2020-08-17: qty 5

## 2020-08-17 MED ORDER — FENTANYL CITRATE (PF) 100 MCG/2ML IJ SOLN: 25.0000 ug | INTRAMUSCULAR | Status: DC | PRN

## 2020-08-17 MED ORDER — EPHEDRINE 5 MG/ML INJ
INTRAVENOUS | Status: AC
  Filled 2020-08-17: qty 10

## 2020-08-17 MED ORDER — ACETAMINOPHEN 160 MG/5ML PO SOLN
325.0000 mg | ORAL | Status: DC | PRN
Start: 2020-08-17 — End: 2020-08-17

## 2020-08-17 MED ORDER — CELECOXIB 200 MG PO CAPS
ORAL_CAPSULE | ORAL | Status: AC
  Filled 2020-08-17: qty 2

## 2020-08-17 MED ORDER — PANTOPRAZOLE SODIUM 40 MG PO TBEC: 40.0000 mg | DELAYED_RELEASE_TABLET | Freq: Every day | ORAL | Status: DC

## 2020-08-17 SURGICAL SUPPLY — 61 items
ADH SKN CLS APL DERMABOND .7 (GAUZE/BANDAGES/DRESSINGS) ×2
APL SRG 38 LTWT LNG FL B (MISCELLANEOUS) ×2
APPLICATOR ARISTA FLEXITIP XL (MISCELLANEOUS) ×1 IMPLANT
BAG DECANTER FOR FLEXI CONT (MISCELLANEOUS) ×1 IMPLANT
CATH FOLEY 2WAY SLVR  5CC 14FR (CATHETERS) ×3
CATH FOLEY 2WAY SLVR 5CC 14FR (CATHETERS) IMPLANT
COVER BACK TABLE 60X90IN (DRAPES) ×3 IMPLANT
COVER TIP SHEARS 8 DVNC (MISCELLANEOUS) ×2 IMPLANT
COVER TIP SHEARS 8MM DA VINCI (MISCELLANEOUS) ×3
COVER WAND RF STERILE (DRAPES) ×1 IMPLANT
DECANTER SPIKE VIAL GLASS SM (MISCELLANEOUS) ×3 IMPLANT
DEFOGGER SCOPE WARMER CLEARIFY (MISCELLANEOUS) ×3 IMPLANT
DERMABOND ADVANCED (GAUZE/BANDAGES/DRESSINGS) ×1
DERMABOND ADVANCED .7 DNX12 (GAUZE/BANDAGES/DRESSINGS) ×2 IMPLANT
DRAPE ARM DVNC X/XI (DISPOSABLE) ×8 IMPLANT
DRAPE COLUMN DVNC XI (DISPOSABLE) ×2 IMPLANT
DRAPE DA VINCI XI ARM (DISPOSABLE) ×12
DRAPE DA VINCI XI COLUMN (DISPOSABLE) ×3
DRAPE UTILITY XL STRL (DRAPES) ×3 IMPLANT
DURAPREP 26ML APPLICATOR (WOUND CARE) ×4 IMPLANT
ELECT REM PT RETURN 9FT ADLT (ELECTROSURGICAL) ×3
ELECTRODE REM PT RTRN 9FT ADLT (ELECTROSURGICAL) ×2 IMPLANT
GLOVE SURG ENC MOIS LTX SZ6 (GLOVE) ×2 IMPLANT
GLOVE SURG ENC MOIS LTX SZ6.5 (GLOVE) ×2 IMPLANT
GLOVE SURG ENC MOIS LTX SZ7 (GLOVE) ×12 IMPLANT
GLOVE SURG UNDER LTX SZ8 (GLOVE) ×3 IMPLANT
GLOVE SURG UNDER POLY LF SZ6.5 (GLOVE) ×4 IMPLANT
GLOVE SURG UNDER POLY LF SZ7 (GLOVE) ×3 IMPLANT
HEMOSTAT ARISTA ABSORB 3G PWDR (HEMOSTASIS) ×1 IMPLANT
HOLDER FOLEY CATH W/STRAP (MISCELLANEOUS) ×1 IMPLANT
IRRIG SUCT STRYKERFLOW 2 WTIP (MISCELLANEOUS) ×3
IRRIGATION SUCT STRKRFLW 2 WTP (MISCELLANEOUS) ×2 IMPLANT
IV NS 1000ML (IV SOLUTION) ×6
IV NS 1000ML BAXH (IV SOLUTION) IMPLANT
KIT TURNOVER CYSTO (KITS) ×3 IMPLANT
LEGGING LITHOTOMY PAIR STRL (DRAPES) ×3 IMPLANT
MANIFOLD NEPTUNE II (INSTRUMENTS) ×1 IMPLANT
MANIPULATOR ADVINCU DEL 3.0 PL (MISCELLANEOUS) ×1 IMPLANT
NEEDLE INSUFFLATION 120MM (ENDOMECHANICALS) ×3 IMPLANT
NS IRRIG 500ML POUR BTL (IV SOLUTION) ×1 IMPLANT
OBTURATOR OPTICAL STANDARD 8MM (TROCAR) ×3
OBTURATOR OPTICAL STND 8 DVNC (TROCAR) ×2
OBTURATOR OPTICALSTD 8 DVNC (TROCAR) ×2 IMPLANT
PACK ROBOT WH (CUSTOM PROCEDURE TRAY) ×3 IMPLANT
PACK ROBOTIC GOWN (GOWN DISPOSABLE) ×3 IMPLANT
PACK TRENDGUARD 450 HYBRID PRO (MISCELLANEOUS) IMPLANT
PAD OB MATERNITY 4.3X12.25 (PERSONAL CARE ITEMS) ×3 IMPLANT
PAD PREP 24X48 CUFFED NSTRL (MISCELLANEOUS) ×3 IMPLANT
PROTECTOR NERVE ULNAR (MISCELLANEOUS) ×6 IMPLANT
SEAL CANN UNIV 5-8 DVNC XI (MISCELLANEOUS) ×6 IMPLANT
SEAL XI 5MM-8MM UNIVERSAL (MISCELLANEOUS) ×9
SET IRRIG Y TYPE TUR BLADDER L (SET/KITS/TRAYS/PACK) ×3 IMPLANT
SET TRI-LUMEN FLTR TB AIRSEAL (TUBING) ×3 IMPLANT
SUT VIC AB 0 CT1 36 (SUTURE) ×3 IMPLANT
SUT VICRYL RAPIDE 3 0 (SUTURE) ×6 IMPLANT
SUT VLOC 180 0 9IN  GS21 (SUTURE) ×3
SUT VLOC 180 0 9IN GS21 (SUTURE) ×2 IMPLANT
TOWEL OR 17X26 10 PK STRL BLUE (TOWEL DISPOSABLE) ×3 IMPLANT
TRAY FOLEY W/BAG SLVR 14FR LF (SET/KITS/TRAYS/PACK) ×3 IMPLANT
TRENDGUARD 450 HYBRID PRO PACK (MISCELLANEOUS) ×3
TROCAR PORT AIRSEAL 5X120 (TROCAR) ×3 IMPLANT

## 2020-08-17 NOTE — Transfer of Care (Signed)
Immediate Anesthesia Transfer of Care Note  Patient: Renee Alvarez  Procedure(s) Performed: Procedure(s) (LRB): XI ROBOTIC ASSISTED LAPAROSCOPIC HYSTERECTOMY AND SALPINGOOOPHORECTOMY (Bilateral) CYSTOSCOPY  Patient Location: PACU  Anesthesia Type: General  Level of Consciousness: awake, sedated, patient cooperative and responds to stimulation  Airway & Oxygen Therapy: Patient Spontanous Breathing and Patient connected to [place] 02 and soft FM   Post-op Assessment: Report given to PACU RN, Post -op Vital signs reviewed and stable and Patient moving all extremities  Post vital signs: Reviewed and stable  Complications: No apparent anesthesia complications

## 2020-08-17 NOTE — Op Note (Signed)
08/17/2020  10:04 AM  PATIENT:  Renee Alvarez  60 y.o. female  PRE-OPERATIVE DIAGNOSIS:  Post menopausal bleeding  POST-OPERATIVE DIAGNOSIS:  Post menopausal bleeding  PROCEDURE:  Procedure(s): XI ROBOTIC ASSISTED LAPAROSCOPIC HYSTERECTOMY AND SALPINGOOOPHORECTOMY (Bilateral) CYSTOSCOPY  SURGEON:  Surgeon(s) and Role:    * Bobbye Charleston, MD - Primary    * Marinone, Edwinna Areola, MD - Second Primary  ANESTHESIA:   general  EBL:  150 mL   MEDICATIONS USED:  OTHER Ropivicaine, Arista, Indigo Carmine, Lasix  SPECIMEN:  Source of Specimen:  uterus, cervix, bilateral tubes and ovaries  DISPOSITION OF SPECIMEN:  PATHOLOGY  COUNTS:  YES  TOURNIQUET:  * No tourniquets in log *  DICTATION: .Note written in EPIC  PLAN OF CARE: Admit for overnight observation  PATIENT DISPOSITION:  PACU - hemodynamically stable.   Delay start of Pharmacological VTE agent (>24hrs) due to surgical blood loss or risk of bleeding: not applicable  Complications:  None.  Findings: 8 weeks size uterus with small serosal fibroids, weight 147 g..  Ovaries were normal bilaterally.  The ureters were identified during multiple points of the case and were always out of the field of dissection.  On cystoscopy, the bladder was intact and bilateral spill was seen from each ureteral oriface.    Medications:  Ancef. Pyridium preop. Exparel  intra peritoneal.  Technique:  After adequate anesthesia was achieved the patient was positioned, prepped and draped in usual sterile fashion.  A speculum was placed in the vagina and the cervix dilated with pratt dilators.  The Advincula with the 3 cm Koh ring was placed.  The  Speculum was removed and the bladder catheterized with a foley.    Attention was turned to the abdomen where a 1 cm incision was made above the umbilicus.  The veress needle was inserted without aspiration of bowel contents or blood.  The robotic trocar was placed and the other three trocar sites were  marked out, all approximately 10 cm from each other and the camera. Two 8.5 mm trocars were placed on either side of the camera port.  A 5 mm assistant port was placed 3 cm above the camera and int the RUQ.  All trocars were inserted under direct visualization of the camera.  The patient was placed in trendelenburg and then the Robot docked.  The bipolar forceps were placed on arm 2 and the Hot shears on arm 1 and introduced under direct visualization of the camera.  I then broke scrub and sat down at the console.  The above findings were noted and the ureters identified well out of the field of dissection.  A small omental adhesion to anterior abdominal wall was taken down with bipolar and shears. The peritoneum parallel to the IP ligament was carefully incised and the posterior broad ligament was then incised under the right IP ligament with care to be above the ureter.  The R IP ligament was cauterized with the bipolar and then incised with the shears.  The round ligament was cauterized with the bipolar and incised with the shears.The Broad and cardinal ligaments were then cauterized against the cervix to the level of the Koh ring, securing the uterine artery.  Each pedicle was then incised with the shears.  The anterior leaf was then incised at the reflection of the vessico-uterine junction and the lateral bladder retracted inferiorly after the round ligament had been divided with the bipolar forceps.    At this point Dr Brien Mates took over  at the console and finished the Robotic portion below.  I scrubbed back in to assist.  The L round ligament was cauterized with the bipolar and divided with the shears;  then the left IP ligament divided with the bipolar forceps and the scissors in the same manner as the right. The broad and cardinal ligaments were then cauterized on the left in the same way.   At the level of the internal os, the uterine arteries were bilaterally cauterized with the bipolar.  The  ureters were identified well out of the field of dissection.   The anterior peritoneum was tented up and incised with the monopolar and the bladder retracted inferiorly.  The vesicovaginal fascia was incised with the monopolar shears and then pushed inferiorly off the vagina to about one cm below the Koh ring.   The uterus was amputated at the level of the reflection of the vagina onto the cervix with monopolar cautery and then removed through the vagina.  The pedicles were checked with the gas pressure down and found to be hemostatic.  The vaginal cuff was closed with a running stitch of 2-0 V-Locand checked digitally that it was closed.  The Robot was undocked and ropivicaine and arista placed inside the peritoneal cavity at the cuff.  All instruments and trocars were withdrawn from the abdomen, the abdomen desufflated.  The skin incisions were closed with 4-0 vicryl R and dermabond.  The cystoscope was placed in the bladder and good spill was seen from the bilateral ureteral orifices.  The bladder was intact.    The patient tolerated the procedure well and was returned to the recovery room in stable condition. Dr. Brien Mates did at least half of the Robotic portion of the case.

## 2020-08-17 NOTE — Anesthesia Postprocedure Evaluation (Signed)
Anesthesia Post Note  Patient: Renee Alvarez  Procedure(s) Performed: XI ROBOTIC ASSISTED LAPAROSCOPIC HYSTERECTOMY AND SALPINGOOOPHORECTOMY (Bilateral Abdomen) CYSTOSCOPY (Urethra)     Patient location during evaluation: PACU Anesthesia Type: General Level of consciousness: awake and alert Pain management: pain level controlled Vital Signs Assessment: post-procedure vital signs reviewed and stable Respiratory status: spontaneous breathing, nonlabored ventilation, respiratory function stable and patient connected to nasal cannula oxygen Cardiovascular status: blood pressure returned to baseline and stable Postop Assessment: no apparent nausea or vomiting Anesthetic complications: no   No complications documented.  Last Vitals:  Vitals:   08/17/20 1205 08/17/20 1256  BP: 132/72 (!) 141/71  Pulse: 63 70  Resp: 15 15  Temp: (!) 36.3 C (!) 36.4 C  SpO2: 95% 96%    Last Pain:  Vitals:   08/17/20 1256  TempSrc:   PainSc: 2                  [name]

## 2020-08-17 NOTE — Discharge Summary (Signed)
Physician Discharge Summary  Patient ID: Renee Alvarez MRN: [id number] DOB/AGE: November 19, 1960 60 y.o.  Admit date: 08/17/2020 Discharge date: 08/17/2020  Admission Diagnoses:PMP bleeding, thickened endometrium  Discharge Diagnoses: same Active Problems:   * No active hospital problems. *   Discharged Condition: good  Hospital Course: Uncomplicated robotic TLH, salpingectomies    Consults: None  Significant Diagnostic Studies: labs:  Results for orders placed or performed during the hospital encounter of 08/17/20 (from the past 24 hour(s))  ABO/Rh     Status: None   Collection Time: 08/17/20  6:45 AM  Result Value Ref Range   ABO/RH(D)      Jenetta Downer NEG Performed at [facility] [address] [place], [place] [address]     Treatments: surgery: Uncomplicated robotic TLH, salpingectomies    Discharge Exam: Blood pressure (!) 144/81, pulse 82, temperature 98.7 F (37.1 C), temperature source Oral, resp. rate (!) 28, height 5\' 7"  (1.702 m), weight 110.6 kg, SpO2 100 %.   Disposition:    Allergies as of 08/17/2020      Reactions   Penicillins Rash   Severe rash      Medication List    STOP taking these medications   acetaminophen 500 MG tablet Commonly known as: TYLENOL     TAKE these medications   allopurinol 100 MG tablet Commonly known as: ZYLOPRIM TAKE 1 TABLET BY MOUTH 2 TIMES DAILY   escitalopram 10 MG tablet Commonly known as: LEXAPRO TAKE 1 TABLET (10 MG TOTAL) BY MOUTH DAILY.   fluconazole 150 MG tablet Commonly known as: DIFLUCAN TAKE 1 TABLET BY MOUTH EVERY 72 HOURS.   fluticasone 0.05 % cream Commonly known as: CUTIVATE Apply topically 2 (two) times daily as needed. 2 % per pt   ibuprofen 200 MG tablet Commonly known as: ADVIL Take 200 mg by mouth every 6 (six) hours as needed.   ketoconazole 2 % cream Commonly known as: NIZORAL Apply 1 application topically 2 (two) times daily. What changed:   when to take this  reasons to  take this   levocetirizine 5 MG tablet Commonly known as: XYZAL Take 10 mg by mouth daily. Takes 2 of 10 mg in am per pt   metroNIDAZOLE 500 MG tablet Commonly known as: FLAGYL TAKE 1 TABLET BY MOUTH EVERY 12 HOURS FOR 7 DAYS   oxyCODONE-acetaminophen 5-325 MG tablet Commonly known as: PERCOCET/ROXICET Take 1 tablet by mouth every 4 (four) hours as needed for severe pain.   pantoprazole 40 MG tablet Commonly known as: PROTONIX Take 1 tablet (40 mg total) by mouth daily before breakfast. What changed:   when to take this  reasons to take this   progesterone 200 MG capsule Commonly known as: PROMETRIUM TAKE 1 CAPSULE BY MOUTH ONCE A DAY   progesterone 200 MG capsule Commonly known as: PROMETRIUM Take 200 mg by mouth at bedtime.   telmisartan-hydrochlorothiazide 80-25 MG tablet Commonly known as: MICARDIS HCT TAKE 1 TABLET BY MOUTH ONCE A DAY   UNABLE TO FIND Compounded formulary with terb 3 % fluc 2% teatree 5 % urea 10 % ib42 apply to left great toe 1 to 2 x daily   Vitamin D (Ergocalciferol) 1.25 MG (50000 UNIT) Caps capsule Commonly known as: DRISDOL TAKE 1 CAPSULE BY MOUTH EVERY 7 DAYS. What changed:   how much to take  additional instructions       Follow-up Information    Rowland Lathe, MD Follow up in 2 week(s).   Specialty:  Obstetrics and Gynecology Contact information: [address] [place] [address] [phone]               Signed: Daria Pastures 08/17/2020, 10:24 AM

## 2020-08-17 NOTE — Anesthesia Procedure Notes (Signed)
Procedure Name: Intubation Date/Time: 08/17/2020 7:40 AM Performed by: Justice Rocher, CRNA Pre-anesthesia Checklist: Patient identified, Emergency Drugs available, Suction available, Patient being monitored and Timeout performed Patient Re-evaluated:Patient Re-evaluated prior to induction Oxygen Delivery Method: Circle system utilized Preoxygenation: Pre-oxygenation with 100% oxygen Induction Type: IV induction Ventilation: Mask ventilation without difficulty Laryngoscope Size: Mac and 4 Grade View: Grade II Tube type: Oral Tube size: 7.5 mm Number of attempts: 1 Airway Equipment and Method: Stylet and Oral airway Placement Confirmation: ETT inserted through vocal cords under direct vision,  positive ETCO2 and breath sounds checked- equal and bilateral Secured at: 22 cm Tube secured with: Tape Dental Injury: Teeth and Oropharynx as per pre-operative assessment

## 2020-08-17 NOTE — Brief Op Note (Signed)
08/17/2020  10:04 AM  PATIENT:  [name]  60 y.o. female  PRE-OPERATIVE DIAGNOSIS:  Post menopausal bleeding  POST-OPERATIVE DIAGNOSIS:  Post menopausal bleeding  PROCEDURE:  Procedure(s): XI ROBOTIC ASSISTED LAPAROSCOPIC HYSTERECTOMY AND SALPINGOOOPHORECTOMY (Bilateral) CYSTOSCOPY  SURGEON:  Surgeon(s) and Role:    * Bobbye Charleston, MD - Primary    * Marinone, Edwinna Areola, MD - Second Primary  ANESTHESIA:   general  EBL:  150 mL   MEDICATIONS USED:  OTHER Ropivicaine, Arista, Indigo Carmine, Lasix  SPECIMEN:  Source of Specimen:  uterus, cervix, bilateral tubes and ovaries  DISPOSITION OF SPECIMEN:  PATHOLOGY  COUNTS:  YES  TOURNIQUET:  * No tourniquets in log *  DICTATION: .Note written in EPIC  PLAN OF CARE: Admit for overnight observation  PATIENT DISPOSITION:  PACU - hemodynamically stable.   Delay start of Pharmacological VTE agent (>24hrs) due to surgical blood loss or risk of bleeding: not applicable

## 2020-08-17 NOTE — Discharge Instructions (Signed)

## 2020-08-17 NOTE — H&P (Signed)
There has been no change in the patients history, status or exam since the history and physical.  Vitals:   08/12/20 0901 08/17/20 0623  BP:  (!) 144/81  Pulse:  82  Resp:  (!) 28  Temp:  98.7 F (37.1 C)  TempSrc:  Oral  SpO2:  100%  Weight: 108.9 kg 110.6 kg  Height: 5\' 7"  (1.702 m) 5\' 7"  (1.702 m)    Results for orders placed or performed during the hospital encounter of 08/14/20 (from the past 72 hour(s))  SARS CORONAVIRUS 2 (TAT 6-24 HRS) Nasopharyngeal Nasopharyngeal Swab     Status: None   Collection Time: 08/14/20  1:58 PM   Specimen: Nasopharyngeal Swab  Result Value Ref Range   SARS Coronavirus 2 NEGATIVE NEGATIVE    Comment: (NOTE) SARS-CoV-2 target nucleic acids are NOT DETECTED.  The SARS-CoV-2 RNA is generally detectable in upper and lower respiratory specimens during the acute phase of infection. Negative results do not preclude SARS-CoV-2 infection, do not rule out co-infections with other pathogens, and should not be used as the sole basis for treatment or other patient management decisions. Negative results must be combined with clinical observations, patient history, and epidemiological information. The expected result is Negative.  Fact Sheet for Patients: SugarRoll.be  Fact Sheet for Healthcare Providers: https://www.woods-mathews.com/  This test is not yet approved or cleared by the [place] FDA and  has been authorized for detection and/or diagnosis of SARS-CoV-2 by FDA under an Emergency Use Authorization (EUA). This EUA will remain  in effect (meaning this test can be used) for the duration of the COVID-19 declaration under Se ction 564(b)(1) of the Act, 21 U.S.C. section 360bbb-3(b)(1), unless the authorization is terminated or revoked sooner.  Performed at [facility], [place] [address], [place], [place] [address]     Renee Alvarez

## 2020-08-18 ENCOUNTER — Encounter (HOSPITAL_BASED_OUTPATIENT_CLINIC_OR_DEPARTMENT_OTHER): Payer: Self-pay | Admitting: Obstetrics and Gynecology

## 2020-08-19 LAB — SURGICAL PATHOLOGY

## 2020-09-01 ENCOUNTER — Other Ambulatory Visit (HOSPITAL_COMMUNITY): Payer: Self-pay

## 2020-09-01 ENCOUNTER — Other Ambulatory Visit: Payer: Self-pay | Admitting: Family Medicine

## 2020-09-01 MED ORDER — TELMISARTAN-HCTZ 80-25 MG PO TABS
1.0000 | ORAL_TABLET | Freq: Every day | ORAL | 0 refills | Status: DC
  Filled 2020-09-01: qty 90, 90d supply, fill #0

## 2020-09-01 MED FILL — Allopurinol Tab 100 MG: ORAL | 30 days supply | Qty: 60 | Fill #0 | Status: AC

## 2020-09-01 MED FILL — Ergocalciferol Cap 1.25 MG (50000 Unit): ORAL | 35 days supply | Qty: 5 | Fill #0 | Status: AC

## 2020-09-01 MED FILL — Escitalopram Oxalate Tab 10 MG (Base Equiv): ORAL | 30 days supply | Qty: 30 | Fill #0 | Status: AC

## 2020-09-03 ENCOUNTER — Other Ambulatory Visit (HOSPITAL_COMMUNITY): Payer: Self-pay

## 2020-09-03 DIAGNOSIS — Z9079 Acquired absence of other genital organ(s): Secondary | ICD-10-CM | POA: Diagnosis not present

## 2020-11-05 ENCOUNTER — Other Ambulatory Visit: Payer: Self-pay | Admitting: Family Medicine

## 2020-11-05 ENCOUNTER — Other Ambulatory Visit (HOSPITAL_COMMUNITY): Payer: Self-pay

## 2020-11-05 MED FILL — Allopurinol Tab 100 MG: ORAL | 30 days supply | Qty: 60 | Fill #1 | Status: AC

## 2020-11-06 ENCOUNTER — Other Ambulatory Visit (HOSPITAL_COMMUNITY): Payer: Self-pay

## 2020-11-06 MED ORDER — TELMISARTAN-HCTZ 80-25 MG PO TABS
1.0000 | ORAL_TABLET | Freq: Every day | ORAL | 0 refills | Status: DC
  Filled 2020-11-06: qty 90, 90d supply, fill #0

## 2020-12-05 ENCOUNTER — Other Ambulatory Visit (HOSPITAL_COMMUNITY): Payer: Self-pay

## 2020-12-10 ENCOUNTER — Other Ambulatory Visit (HOSPITAL_COMMUNITY): Payer: Self-pay

## 2021-02-02 ENCOUNTER — Other Ambulatory Visit: Payer: Self-pay

## 2021-02-02 ENCOUNTER — Encounter: Payer: Self-pay | Admitting: Internal Medicine

## 2021-02-02 ENCOUNTER — Ambulatory Visit: Payer: BC Managed Care – PPO | Admitting: Internal Medicine

## 2021-02-02 ENCOUNTER — Ambulatory Visit (INDEPENDENT_AMBULATORY_CARE_PROVIDER_SITE_OTHER): Payer: BC Managed Care – PPO

## 2021-02-02 DIAGNOSIS — Z20822 Contact with and (suspected) exposure to covid-19: Secondary | ICD-10-CM

## 2021-02-02 DIAGNOSIS — U071 COVID-19: Secondary | ICD-10-CM

## 2021-02-02 DIAGNOSIS — J029 Acute pharyngitis, unspecified: Secondary | ICD-10-CM

## 2021-02-02 LAB — POCT INFLUENZA A/B
Influenza A, POC: NEGATIVE
Influenza B, POC: NEGATIVE

## 2021-02-02 MED ORDER — GUAIFENESIN-DM 100-10 MG/5ML PO SYRP: 5.0000 mL | ORAL_SOLUTION | ORAL | 0 refills | Status: DC | PRN

## 2021-02-02 MED ORDER — LIDOCAINE VISCOUS HCL 2 % MT SOLN: 5.0000 mL | Freq: Four times a day (QID) | OROMUCOSAL | 0 refills | Status: DC | PRN

## 2021-02-02 NOTE — Progress Notes (Addendum)
Virtual Visit via Telephone Note   This visit type was conducted due to national recommendations for restrictions regarding the COVID-19 Pandemic (e.g. social distancing) in an effort to limit this patient's exposure and mitigate transmission in our community.  Due to her co-morbid illnesses, this patient is at least at moderate risk for complications without adequate follow up.  This format is felt to be most appropriate for this patient at this time.  The patient did not have access to video technology/had technical difficulties with video requiring transitioning to audio format only (telephone).  All issues noted in this document were discussed and addressed.  No physical exam could be performed with this format.  Evaluation Performed:  Follow-up visit  Date:  02/02/2021   ID:  Renee, Alvarez [birth year], MRN [id number]  Patient Location: Home Provider Location: Office/Clinic  Participants: Patient Location of Patient: Home Location of Provider: Telehealth Consent was obtain for visit to be over via telehealth. I verified that I am speaking with the correct person using two identifiers.  PCP:  Lindell Spar, MD   Chief Complaint:  Cough, sore throat  History of Present Illness:    Renee Alvarez is a 60 y.o. female who has a televisit for complaint of cough, sore throat and nasal congestion for last 2 days.  She had episode of fever yesterday.  She has been having hoarse voice and has tried Chloraseptic lozenges so far.  Her husband and her daughter tested positive for COVID in the last week, but she tested negative at home.  She denies any dyspnea or wheezing currently.  Denies any chest pain or palpitations.  The patient does have symptoms concerning for COVID-19 infection (fever, chills, cough, or new shortness of breath).   Past Medical, Surgical, Social History, Allergies, and Medications have been Reviewed.  Past Medical History:  Diagnosis Date   Allergic rhinitis due  to pollen    Anxiety    Arthritis    oa lumbar region   Breast cyst, right 05/23/2019   Chronic back pain    Essential (primary) hypertension    GERD (gastroesophageal reflux disease)    Gout last flare up jan 2022   Headache    Heart murmur    told years ago had mild murmur none heard recently per pt   History of hiatal hernia    Hypertension    IBS (irritable bowel syndrome)    ibsd per pt   Low back pain    Obesity, unspecified    PMB (postmenopausal bleeding)    PONV (postoperative nausea and vomiting)    slower to [place] after cholecystectomy   Seasonal allergies    Skin irritation    both breats where bra rubs healing well using fluticasone prn per pt   Toenail fungus    left great toe using compounded med for   Wears contact lenses    Wears glasses    Zoster without complications    Past Surgical History:  Procedure Laterality Date   BALLOON DILATION  05/04/2012   Procedure: BALLOON DILATION;  Surgeon: Rogene Houston, MD;  [facility];  Service: Endoscopy;  Laterality: N/A;   CESAREAN SECTION  1997   CHOLECYSTECTOMY  yrs ago   laparoscopic   COLONOSCOPY WITH ESOPHAGOGASTRODUODENOSCOPY (EGD)  05/04/2012   Procedure: COLONOSCOPY WITH ESOPHAGOGASTRODUODENOSCOPY (EGD);  Surgeon: Rogene Houston, MD;  [facility];  Service: Endoscopy;  Laterality: N/A;  730   Cyst removed  from right finger  yrs ago   CYSTOSCOPY  08/17/2020   Procedure: CYSTOSCOPY;  Surgeon: Bobbye Charleston, MD;  Location: [facility];  Service: Gynecology;;   Expoloratory Lap  [phone]   MALONEY DILATION  05/04/2012   Procedure: Venia Minks DILATION;  Surgeon: Rogene Houston, MD;  [facility];  Service: Endoscopy;  Laterality: N/A;   ROBOTIC ASSISTED LAPAROSCOPIC HYSTERECTOMY AND SALPINGECTOMY Bilateral 08/17/2020   Procedure: XI ROBOTIC ASSISTED LAPAROSCOPIC HYSTERECTOMY AND SALPINGOOOPHORECTOMY;  Surgeon: Bobbye Charleston, MD;  Location: [facility];  Service: Gynecology;  Laterality: Bilateral;   SAVORY DILATION  05/04/2012   Procedure: SAVORY DILATION;  Surgeon: Rogene Houston, MD;  [facility];  Service: Endoscopy;  Laterality: N/A;     Current Meds  Medication Sig   allopurinol (ZYLOPRIM) 100 MG tablet TAKE 1 TABLET BY MOUTH 2 TIMES DAILY   escitalopram (LEXAPRO) 10 MG tablet TAKE 1 TABLET (10 MG TOTAL) BY MOUTH DAILY.   fluconazole (DIFLUCAN) 150 MG tablet TAKE 1 TABLET BY MOUTH EVERY 72 HOURS.   fluticasone (CUTIVATE) 0.05 % cream Apply topically 2 (two) times daily as needed. 2 % per pt   ibuprofen (ADVIL) 200 MG tablet Take 200 mg by mouth every 6 (six) hours as needed.   ketoconazole (NIZORAL) 2 % cream Apply 1 application topically 2 (two) times daily.   levocetirizine (XYZAL) 5 MG tablet Take 10 mg by mouth daily. Takes 2 of 10 mg in am per pt   metroNIDAZOLE (FLAGYL) 500 MG tablet TAKE 1 TABLET BY MOUTH EVERY 12 HOURS FOR 7 DAYS   pantoprazole (PROTONIX) 40 MG tablet Take 1 tablet (40 mg total) by mouth daily before breakfast. (Patient taking differently: Take 40 mg by mouth daily as needed.)   telmisartan-hydrochlorothiazide (MICARDIS HCT) 80-25 MG tablet TAKE 1 TABLET BY MOUTH ONCE A DAY   UNABLE TO FIND Compounded formulary with terb 3 % fluc 2% teatree 5 % urea 10 % ib42 apply to left great toe 1 to 2 x daily   Vitamin D, Ergocalciferol, (DRISDOL) 1.25 MG (50000 UNIT) CAPS capsule TAKE 1 CAPSULE BY MOUTH EVERY 7 DAYS. (Patient taking differently: Take 50,000 Units by mouth every 7 (seven) days. tuesdays)     Allergies:   Penicillins   ROS:   Please see the history of present illness.     All other systems reviewed and are negative.   Labs/Other Tests and Data Reviewed:    Recent Labs: 07/24/2020: ALT 16; TSH 1.070 08/14/2020: BUN 17; Creatinine, Ser 0.96; Hemoglobin 13.5; Platelets 264; Potassium 3.4; Sodium 137   Recent Lipid Panel Lab Results  Component Value Date/Time   CHOL 173  07/24/2020 08:12 AM   TRIG 168 (H) 07/24/2020 08:12 AM   HDL 38 (L) 07/24/2020 08:12 AM   CHOLHDL 4.6 (H) 07/24/2020 08:12 AM   LDLCALC 105 (H) 07/24/2020 08:12 AM    Wt Readings from Last 3 Encounters:  08/17/20 243 lb 12.8 oz (110.6 kg)  08/14/20 240 lb (108.9 kg)  10/04/19 284 lb 1.3 oz (128.9 kg)     ASSESSMENT & PLAN:    Suspected COVID-19 infection Check COVID-19 RT-PCR and rapid flu Robitussin as needed for cough Magic mouthwash for sore throat and hoarse voice Tylenol as needed for fever or myalgias   Addendum: COVID test positive - started Paxlovid.  Time:   Today, I have spent 13 minutes reviewing the chart, including problem list, medications, and with the patient with telehealth technology  discussing the above problems.   Medication Adjustments/Labs and Tests Ordered: Current medicines are reviewed at length with the patient today.  Concerns regarding medicines are outlined above.   Tests Ordered: No orders of the defined types were placed in this encounter.   Medication Changes: No orders of the defined types were placed in this encounter.    Note: This dictation was prepared with Dragon dictation along with smaller phrase technology. Similar sounding words can be transcribed inadequately or may not be corrected upon review. Any transcriptional errors that result from this process are unintentional.      Disposition:  Follow up  Signed, Lindell Spar, MD  02/02/2021 1:05 PM     [place]

## 2021-02-04 ENCOUNTER — Encounter: Payer: Self-pay | Admitting: Internal Medicine

## 2021-02-04 ENCOUNTER — Encounter: Payer: Self-pay | Admitting: *Deleted

## 2021-02-04 LAB — NOVEL CORONAVIRUS, NAA: SARS-CoV-2, NAA: DETECTED — AB

## 2021-02-04 LAB — SARS-COV-2, NAA 2 DAY TAT

## 2021-02-04 MED ORDER — NIRMATRELVIR/RITONAVIR (PAXLOVID)TABLET: 3.0000 | ORAL_TABLET | Freq: Two times a day (BID) | ORAL | 0 refills | Status: AC

## 2021-02-04 NOTE — Addendum Note (Signed)
Addended byIhor Dow on: 02/04/2021 08:21 AM   Modules accepted: Orders

## 2021-02-04 NOTE — Telephone Encounter (Signed)
Work note provided and result notes given

## 2021-02-11 ENCOUNTER — Other Ambulatory Visit (HOSPITAL_COMMUNITY): Payer: Self-pay

## 2021-03-10 ENCOUNTER — Other Ambulatory Visit: Payer: Self-pay | Admitting: Family Medicine

## 2021-06-10 ENCOUNTER — Other Ambulatory Visit: Payer: Self-pay | Admitting: Family Medicine

## 2021-07-09 ENCOUNTER — Ambulatory Visit: Payer: BC Managed Care – PPO | Admitting: Internal Medicine

## 2021-07-29 ENCOUNTER — Other Ambulatory Visit: Payer: Self-pay | Admitting: Internal Medicine

## 2021-08-25 DIAGNOSIS — Z124 Encounter for screening for malignant neoplasm of cervix: Secondary | ICD-10-CM | POA: Diagnosis not present

## 2021-08-25 DIAGNOSIS — Z01419 Encounter for gynecological examination (general) (routine) without abnormal findings: Secondary | ICD-10-CM | POA: Diagnosis not present

## 2021-08-25 DIAGNOSIS — Z1272 Encounter for screening for malignant neoplasm of vagina: Secondary | ICD-10-CM | POA: Diagnosis not present

## 2021-08-25 DIAGNOSIS — Z1231 Encounter for screening mammogram for malignant neoplasm of breast: Secondary | ICD-10-CM | POA: Diagnosis not present

## 2021-08-25 LAB — HM PAP SMEAR: HM Pap smear: NEGATIVE

## 2021-08-25 LAB — HM MAMMOGRAPHY

## 2021-09-02 ENCOUNTER — Encounter: Payer: Self-pay | Admitting: *Deleted

## 2021-09-07 ENCOUNTER — Other Ambulatory Visit: Payer: Self-pay | Admitting: *Deleted

## 2021-09-07 ENCOUNTER — Ambulatory Visit: Payer: BC Managed Care – PPO | Admitting: Internal Medicine

## 2021-09-07 ENCOUNTER — Encounter: Payer: Self-pay | Admitting: Internal Medicine

## 2021-09-07 VITALS — BP 132/82 | HR 69 | Resp 18 | Ht 67.0 in | Wt 262.0 lb

## 2021-09-07 DIAGNOSIS — Z114 Encounter for screening for human immunodeficiency virus [HIV]: Secondary | ICD-10-CM

## 2021-09-07 DIAGNOSIS — M5136 Other intervertebral disc degeneration, lumbar region: Secondary | ICD-10-CM

## 2021-09-07 DIAGNOSIS — M109 Gout, unspecified: Secondary | ICD-10-CM | POA: Diagnosis not present

## 2021-09-07 DIAGNOSIS — I10 Essential (primary) hypertension: Secondary | ICD-10-CM

## 2021-09-07 DIAGNOSIS — E559 Vitamin D deficiency, unspecified: Secondary | ICD-10-CM | POA: Diagnosis not present

## 2021-09-07 DIAGNOSIS — U071 COVID-19: Secondary | ICD-10-CM

## 2021-09-07 DIAGNOSIS — R7303 Prediabetes: Secondary | ICD-10-CM

## 2021-09-07 DIAGNOSIS — F419 Anxiety disorder, unspecified: Secondary | ICD-10-CM | POA: Diagnosis not present

## 2021-09-07 DIAGNOSIS — Z789 Other specified health status: Secondary | ICD-10-CM

## 2021-09-07 DIAGNOSIS — Z1159 Encounter for screening for other viral diseases: Secondary | ICD-10-CM | POA: Diagnosis not present

## 2021-09-07 DIAGNOSIS — M51369 Other intervertebral disc degeneration, lumbar region without mention of lumbar back pain or lower extremity pain: Secondary | ICD-10-CM

## 2021-09-07 DIAGNOSIS — Z6841 Body Mass Index (BMI) 40.0 and over, adult: Secondary | ICD-10-CM

## 2021-09-07 DIAGNOSIS — M150 Primary generalized (osteo)arthritis: Secondary | ICD-10-CM

## 2021-09-07 DIAGNOSIS — M159 Polyosteoarthritis, unspecified: Secondary | ICD-10-CM

## 2021-09-07 MED ORDER — TRAMADOL HCL 50 MG PO TABS: 50.0000 mg | ORAL_TABLET | Freq: Three times a day (TID) | ORAL | 0 refills | Status: DC | PRN

## 2021-09-07 MED ORDER — ESCITALOPRAM OXALATE 10 MG PO TABS
10.0000 mg | ORAL_TABLET | Freq: Every day | ORAL | 1 refills | Status: DC
  Filled 2022-02-17: qty 30, 30d supply, fill #0

## 2021-09-07 NOTE — Assessment & Plan Note (Signed)
Diet modification and moderate exercise advised ?She is a good candidate for GLP-1 agonist therapy, advised to check with her insurance about Wegovy ?

## 2021-09-07 NOTE — Progress Notes (Signed)
? ?Established Patient Office Visit ? ?Subjective:  ?Patient ID: Renee Alvarez, female    DOB: 1960-09-09  Age: 61 y.o. MRN: [id number] ? ?CC:  ?Chief Complaint  ?Patient presents with  ? Follow-up  ?  Follow up would like to discuss weight loss also would like to discuss back pain hydrocodone as needed sometimes feels like bees are stinging at her joints also would like to discuss anxiety   ? ? ?HPI ?Renee Alvarez is a 61 y.o. female with past medical history of HTN, GERD, GAD, chronic low back pain and morbid obesity who presents for f/u of her chronic medical conditions. ? ?Back pain: Complains of chronic low back pain, which is worse with prolonged standing and bending.  She has seen orthopedic surgeon in [place], and was told of arthritis in lumbar spine.  She denies any heavy lifting, recent injury or fall.  Denies any numbness or weakness of the LE.  She takes Tylenol and ibuprofen as needed for low back pain currently.  She used to take hydrocodone for severe pain in the past, but required it very infrequently. ? ?Joint pain/extremities pain: She also complains of intermittent knee pain, elbow pain, wrist pain and diffuse UE and LE pain, which is sharp -describes as knees tingling, last for few minutes and improves with rubbing the joints. ? ?HTN: BP is well-controlled. Takes medications regularly. Patient denies headache, dizziness, chest pain, dyspnea or palpitations. ? ?GAD: She used to take Lexapro for GAD, but states that she took it as as needed.  She currently complains of spells of anxiety at times.  Denies any anhedonia, SI or HI currently. ? ?Obesity: She has been trying to do low-carb diet and has been watching over portions of her diet.  She asks about injectable GLP-1 agonist therapy for weight loss.  I had lengthy discussion about medical weight loss options and have advised her to check with her insurance if it is covered by her insurance. ? ?Past Medical History:  ?Diagnosis Date  ? Allergic  rhinitis due to pollen   ? Anxiety   ? Arthritis   ? oa lumbar region  ? Breast cyst, right 05/23/2019  ? Chronic back pain   ? Essential (primary) hypertension   ? GERD (gastroesophageal reflux disease)   ? Gout last flare up jan 2022  ? Headache   ? Heart murmur   ? told years ago had mild murmur none heard recently per pt  ? History of hiatal hernia   ? Hypertension   ? IBS (irritable bowel syndrome)   ? ibsd per pt  ? Low back pain   ? Obesity, unspecified   ? PMB (postmenopausal bleeding)   ? PONV (postoperative nausea and vomiting)   ? slower to [facility] after cholecystectomy  ? Seasonal allergies   ? Skin irritation   ? both breats where bra rubs healing well using fluticasone prn per pt  ? Toenail fungus   ? left great toe using compounded med for  ? Wears contact lenses   ? Wears glasses   ? Zoster without complications   ? ? ?Past Surgical History:  ?Procedure Laterality Date  ? BALLOON DILATION  05/04/2012  ? Procedure: BALLOON DILATION;  Surgeon: Rogene Houston, MD;  [facility];  Service: Endoscopy;  Laterality: N/A;  ? [place]  ? CHOLECYSTECTOMY  yrs ago  ? laparoscopic  ? COLONOSCOPY WITH ESOPHAGOGASTRODUODENOSCOPY (EGD)  05/04/2012  ? Procedure: COLONOSCOPY WITH ESOPHAGOGASTRODUODENOSCOPY (  EGD);  Surgeon: Rogene Houston, MD;  [facility];  Service: Endoscopy;  Laterality: N/A;  730  ? Cyst removed from right finger  yrs ago  ? CYSTOSCOPY  08/17/2020  ? Procedure: CYSTOSCOPY;  Surgeon: Bobbye Charleston, MD;  Location: [facility];  Service: Gynecology;;  ? Expoloratory Lap  [phone]  ? MALONEY DILATION  05/04/2012  ? Procedure: MALONEY DILATION;  Surgeon: Rogene Houston, MD;  [facility];  Service: Endoscopy;  Laterality: N/A;  ? ROBOTIC ASSISTED LAPAROSCOPIC HYSTERECTOMY AND SALPINGECTOMY Bilateral 08/17/2020  ? Procedure: XI ROBOTIC ASSISTED LAPAROSCOPIC HYSTERECTOMY AND SALPINGOOOPHORECTOMY;  Surgeon: Bobbye Charleston, MD;  Location:  [facility];  Service: Gynecology;  Laterality: Bilateral;  ? SAVORY DILATION  05/04/2012  ? Procedure: SAVORY DILATION;  Surgeon: Rogene Houston, MD;  [facility];  Service: Endoscopy;  Laterality: N/A;  ? ? ?Family History  ?Problem Relation Age of Onset  ? Cancer Mother   ?     breast  ? Breast cancer Mother 29  ? Cancer Father   ?     prostate  ? Diabetes Sister   ? Hypertension Sister   ? Heart disease Maternal Grandfather   ? Other Paternal Grandfather   ?     cerebral hemorrhage  ? Colon cancer Neg Hx   ? ? ?Social History  ? ?Socioeconomic History  ? Marital status: Married  ?  Spouse name: Not on file  ? Number of children: Not on file  ? Years of education: Not on file  ? Highest education level: Not on file  ?Occupational History  ? Not on file  ?Tobacco Use  ? Smoking status: Never  ? Smokeless tobacco: Never  ?Vaping Use  ? Vaping Use: Never used  ?Substance and Sexual Activity  ? Alcohol use: No  ?  Alcohol/week: 0.0 standard drinks  ? Drug use: No  ? Sexual activity: Yes  ?  Birth control/protection: None  ?Other Topics Concern  ? Not on file  ?Social History Narrative  ? Not on file  ? ?Social Determinants of Health  ? ?Financial Resource Strain: Not on file  ?Food Insecurity: Not on file  ?Transportation Needs: Not on file  ?Physical Activity: Not on file  ?Stress: Not on file  ?Social Connections: Not on file  ?Intimate Partner Violence: Not on file  ? ? ?Outpatient Medications Prior to Visit  ?Medication Sig Dispense Refill  ? allopurinol (ZYLOPRIM) 100 MG tablet TAKE (1) TABLET BY MOUTH TWICE DAILY. 60 tablet 0  ? ibuprofen (ADVIL) 200 MG tablet Take 200 mg by mouth every 6 (six) hours as needed.    ? ketoconazole (NIZORAL) 2 % cream Apply 1 application topically 2 (two) times daily. 30 g 1  ? levocetirizine (XYZAL) 5 MG tablet Take 10 mg by mouth daily. Takes 2 of 10 mg in am per pt    ? telmisartan-hydrochlorothiazide (MICARDIS HCT) 80-25 MG tablet TAKE (1) TABLET BY  MOUTH ONCE DAILY. 90 tablet 0  ? UNABLE TO FIND Compounded formulary with terb 3 % fluc 2% teatree 5 % urea 10 % ib42 apply to left great toe 1 to 2 x daily    ? Vitamin D, Ergocalciferol, (DRISDOL) 1.25 MG (50000 UNIT) CAPS capsule TAKE 1 CAPSULE BY MOUTH ONCE A WEEK. (Patient not taking: Reported on 09/07/2021) 5 capsule 0  ? escitalopram (LEXAPRO) 10 MG tablet TAKE 1 TABLET (10 MG TOTAL) BY MOUTH DAILY. 30 tablet 2  ?  fluticasone (CUTIVATE) 0.05 % cream Apply topically 2 (two) times daily as needed. 2 % per pt (Patient not taking: Reported on 09/07/2021)    ? guaiFENesin-dextromethorphan (ROBITUSSIN DM) 100-10 MG/5ML syrup Take 5 mLs by mouth every 4 (four) hours as needed for cough. 118 mL 0  ? magic mouthwash (lidocaine, diphenhydrAMINE, alum & mag hydroxide) suspension Swish and swallow 5 mLs 4 (four) times daily as needed for mouth pain. 360 mL 0  ? pantoprazole (PROTONIX) 40 MG tablet Take 1 tablet (40 mg total) by mouth daily before breakfast. (Patient taking differently: Take 40 mg by mouth daily as needed.) 90 tablet 3  ? ?No facility-administered medications prior to visit.  ? ? ?Allergies  ?Allergen Reactions  ? Penicillins Rash  ?  Severe rash  ? ? ?ROS ?Review of Systems  ?Constitutional:  Positive for fatigue. Negative for chills and fever.  ?HENT:  Negative for congestion, sinus pressure, sinus pain and sore throat.   ?Eyes:  Negative for pain and discharge.  ?Respiratory:  Negative for cough and shortness of breath.   ?Cardiovascular:  Negative for chest pain and palpitations.  ?Gastrointestinal:  Negative for abdominal pain, diarrhea, nausea and vomiting.  ?Endocrine: Negative for polydipsia and polyuria.  ?Genitourinary:  Negative for dysuria and hematuria.  ?Musculoskeletal:  Positive for arthralgias, back pain and myalgias. Negative for neck pain and neck stiffness.  ?Skin:  Negative for rash.  ?Neurological:  Negative for dizziness and weakness.  ?Psychiatric/Behavioral:  Positive for sleep  disturbance. Negative for agitation and behavioral problems. The patient is nervous/anxious.   ? ?  ?Objective:  ?  ?Physical Exam ?Vitals reviewed.  ?Constitutional:   ?   General: She is not in acute distre

## 2021-09-07 NOTE — Patient Instructions (Signed)
Please start taking Lexapro as prescribed. ? ?Please take Tramadol as needed for severe back pain. Please perform simple back exercises as tolerated. ? ?Please continue to follow low carb diet and perform moderate exercise/walking at least 150 mins/week. ?

## 2021-09-07 NOTE — Assessment & Plan Note (Signed)
Refilled Lexapro ?Advised to take it regularly instead of as needed ?

## 2021-09-07 NOTE — Telephone Encounter (Signed)
Patient called asked if get Vitamin D added your blood work. She has completed a prescription of Vitamin D from Dr patel. ?

## 2021-09-07 NOTE — Assessment & Plan Note (Signed)
BP Readings from Last 1 Encounters:  ?09/07/21 132/82  ? ?Well-controlled with telmisartan HCTZ ?Counseled for compliance with the medications ?Advised DASH diet and moderate exercise/walking, at least 150 mins/week ? ?

## 2021-09-07 NOTE — Assessment & Plan Note (Signed)
Diffuse joint pain could be due to OA ?Tylenol or ibuprofen as needed for now ?She also has UE and LE pain, characteristic of neuropathic pain - offered neurology referral, she prefers to wait for now ?

## 2021-09-07 NOTE — Assessment & Plan Note (Signed)
Chronic low back pain ?Tylenol or ibuprofen as needed for mild-to-moderate pain ?Tramadol as needed for severe pain ?Avoid heavy lifting and frequent bending ?Simple back exercises advised ?

## 2021-09-08 LAB — BASIC METABOLIC PANEL
BUN/Creatinine Ratio: 16 (ref 12–28)
BUN: 19 mg/dL (ref 8–27)
CO2: 23 mmol/L (ref 20–29)
Calcium: 9.6 mg/dL (ref 8.7–10.3)
Chloride: 102 mmol/L (ref 96–106)
Creatinine, Ser: 1.17 mg/dL — ABNORMAL HIGH (ref 0.57–1.00)
Glucose: 97 mg/dL (ref 70–99)
Potassium: 4 mmol/L (ref 3.5–5.2)
Sodium: 140 mmol/L (ref 134–144)
eGFR: 52 mL/min/{1.73_m2} — ABNORMAL LOW (ref >59)

## 2021-09-08 LAB — CBC WITH DIFFERENTIAL/PLATELET
Basophils Absolute: 0.1 10*3/uL (ref 0.0–0.2)
Basos: 1 %
EOS (ABSOLUTE): 0.2 10*3/uL (ref 0.0–0.4)
Eos: 3 %
Hematocrit: 38.1 % (ref 34.0–46.6)
Hemoglobin: 12.9 g/dL (ref 11.1–15.9)
Immature Grans (Abs): 0 10*3/uL (ref 0.0–0.1)
Immature Granulocytes: 0 %
Lymphocytes Absolute: 1.9 10*3/uL (ref 0.7–3.1)
Lymphs: 26 %
MCH: 29.8 pg (ref 26.6–33.0)
MCHC: 33.9 g/dL (ref 31.5–35.7)
MCV: 88 fL (ref 79–97)
Monocytes Absolute: 0.4 10*3/uL (ref 0.1–0.9)
Monocytes: 6 %
Neutrophils Absolute: 4.6 10*3/uL (ref 1.4–7.0)
Neutrophils: 64 %
Platelets: 227 10*3/uL (ref 150–450)
RBC: 4.33 x10E6/uL (ref 3.77–5.28)
RDW: 13.1 % (ref 11.7–15.4)
WBC: 7.2 10*3/uL (ref 3.4–10.8)

## 2021-09-08 LAB — HEMOGLOBIN A1C
Est. average glucose Bld gHb Est-mCnc: 114 mg/dL
Hgb A1c MFr Bld: 5.6 % (ref 4.8–5.6)

## 2021-09-08 LAB — HIV ANTIBODY (ROUTINE TESTING W REFLEX): HIV Screen 4th Generation wRfx: NONREACTIVE

## 2021-09-08 LAB — HEPATITIS C ANTIBODY: Hep C Virus Ab: NONREACTIVE

## 2021-09-09 ENCOUNTER — Other Ambulatory Visit: Payer: Self-pay | Admitting: Internal Medicine

## 2021-09-09 DIAGNOSIS — M109 Gout, unspecified: Secondary | ICD-10-CM

## 2021-09-09 LAB — SPECIMEN STATUS REPORT

## 2021-09-09 LAB — URIC ACID: Uric Acid: 8.9 mg/dL — ABNORMAL HIGH (ref 3.0–7.2)

## 2021-09-09 LAB — VITAMIN D 25 HYDROXY (VIT D DEFICIENCY, FRACTURES): Vit D, 25-Hydroxy: 24.7 ng/mL — ABNORMAL LOW (ref 30.0–100.0)

## 2021-09-09 MED ORDER — ALLOPURINOL 300 MG PO TABS: 300.0000 mg | ORAL_TABLET | Freq: Every day | ORAL | 1 refills | Status: DC

## 2021-09-18 LAB — HBSAB QUANT HBIG ASSESSMENT: HBsAb Quant HBIG Assessment: POSITIVE m[IU]/mL — AB

## 2021-09-27 ENCOUNTER — Other Ambulatory Visit: Payer: Self-pay | Admitting: Family Medicine

## 2021-11-18 IMAGING — MG MM DIGITAL DIAGNOSTIC UNILAT*R* W/ TOMO W/ CAD
6 series · 6 of 18 positions shown · non-contrast
Comparison: Previous exam(s).

CLINICAL DATA: 59-year-old female presenting for six-month
follow-up of a probably benign right breast mass.

EXAM:
DIGITAL DIAGNOSTIC RIGHT MAMMOGRAM WITH TOMO
ULTRASOUND RIGHT BREAST

[R CC synth-2D]
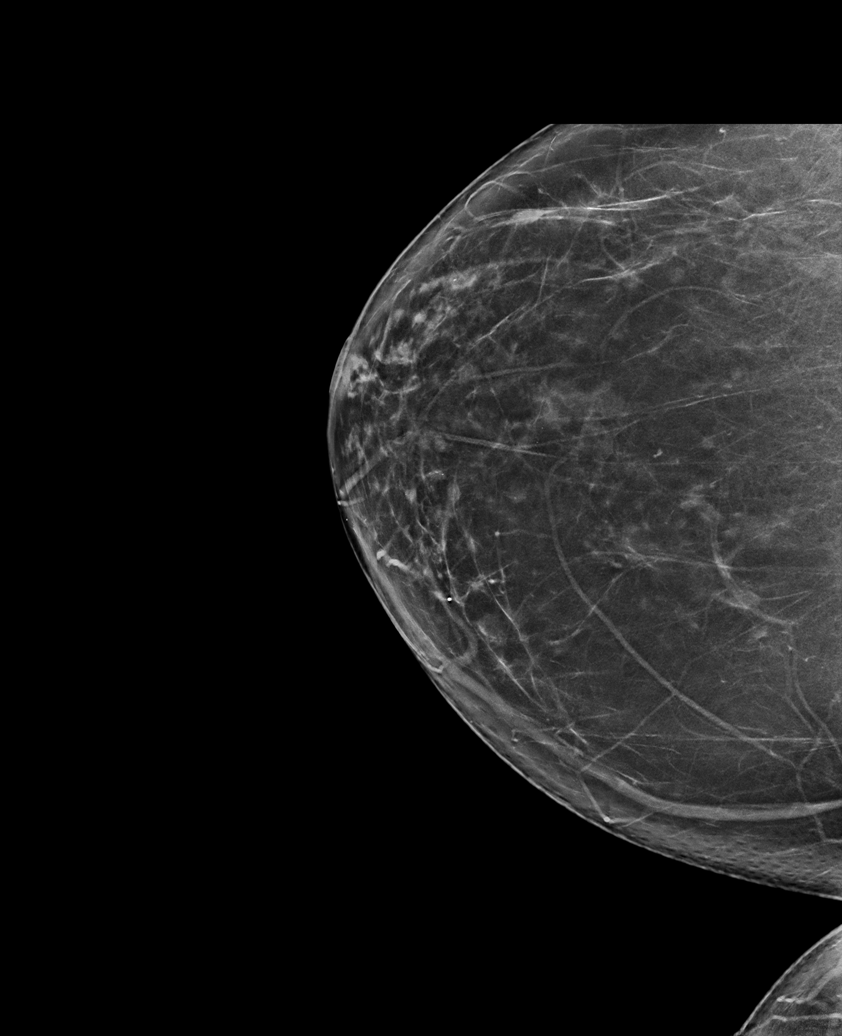

[R MLO synth-2D (1 of 2)]
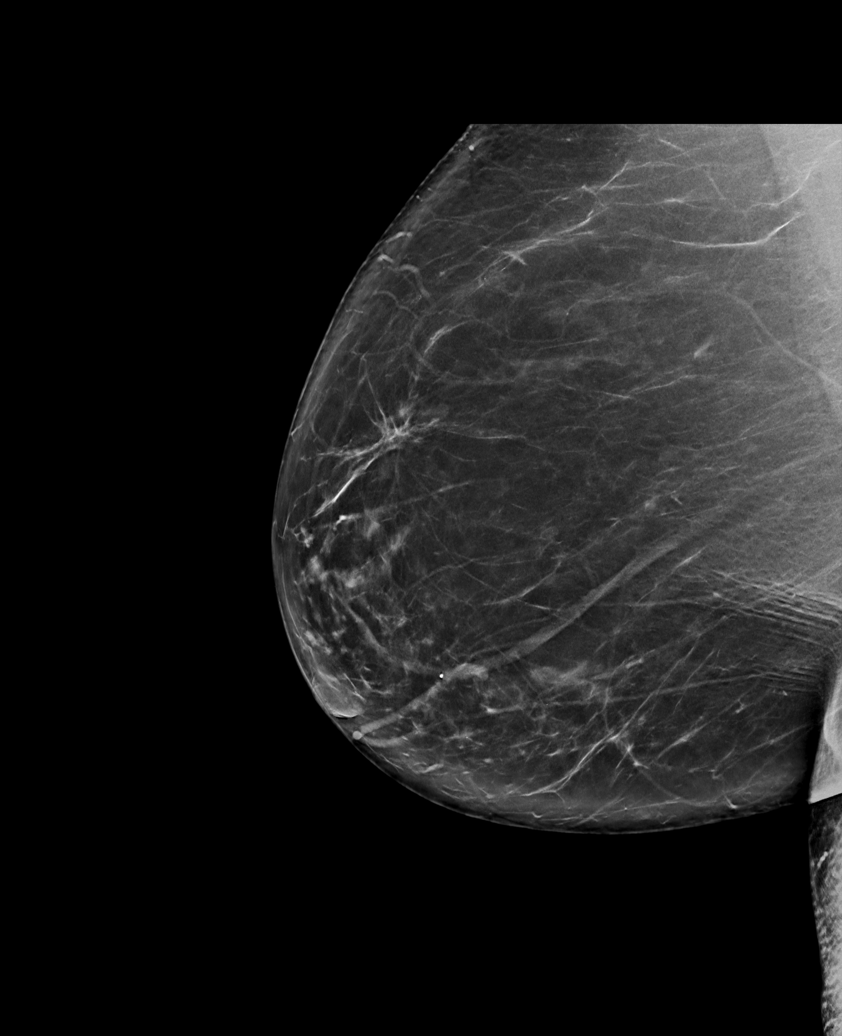

[R MLO synth-2D (2 of 2)]
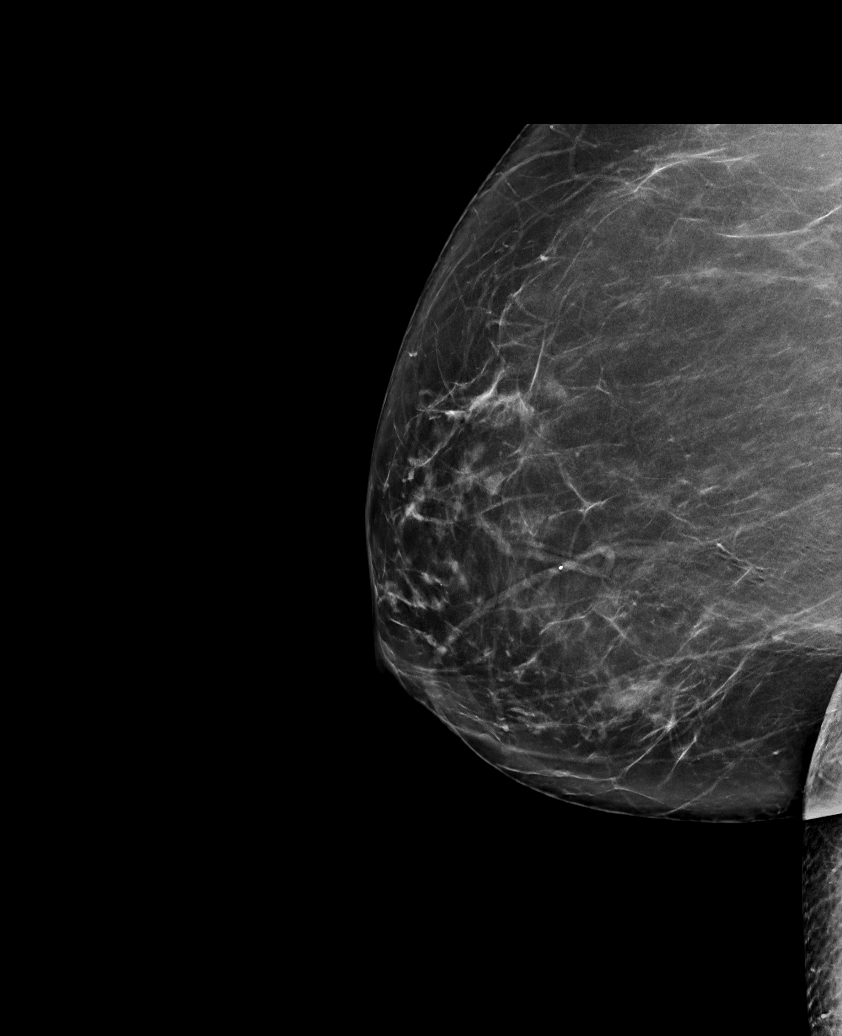

[R MLO tomo (1 of 2) · tomo slice 45/90.0]
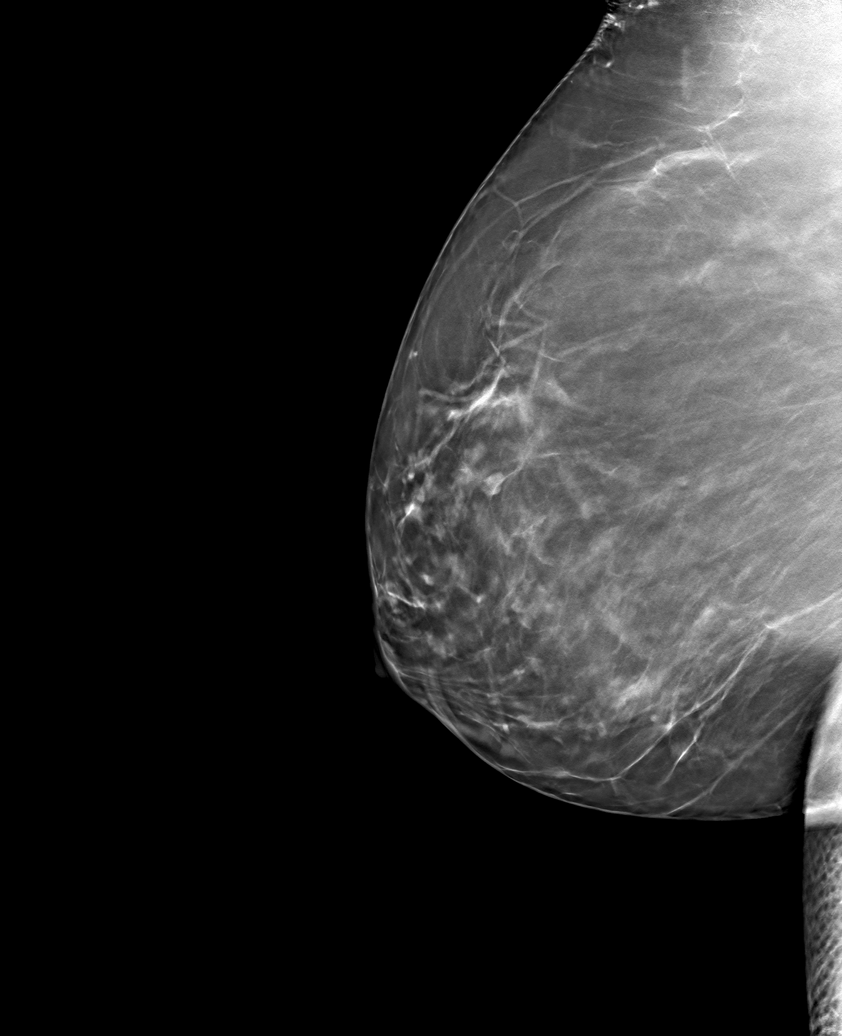

[R CC tomo · tomo slice 39/78.0]
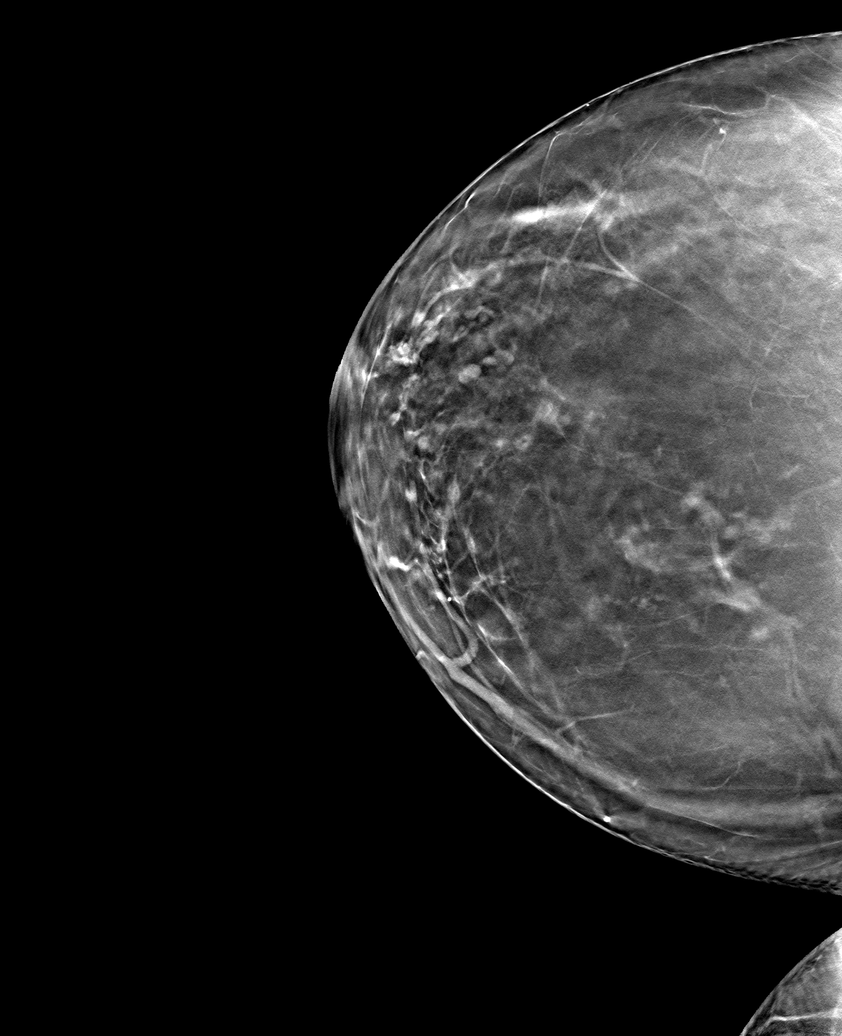

[R MLO tomo (2 of 2) · tomo slice 47/93.0]
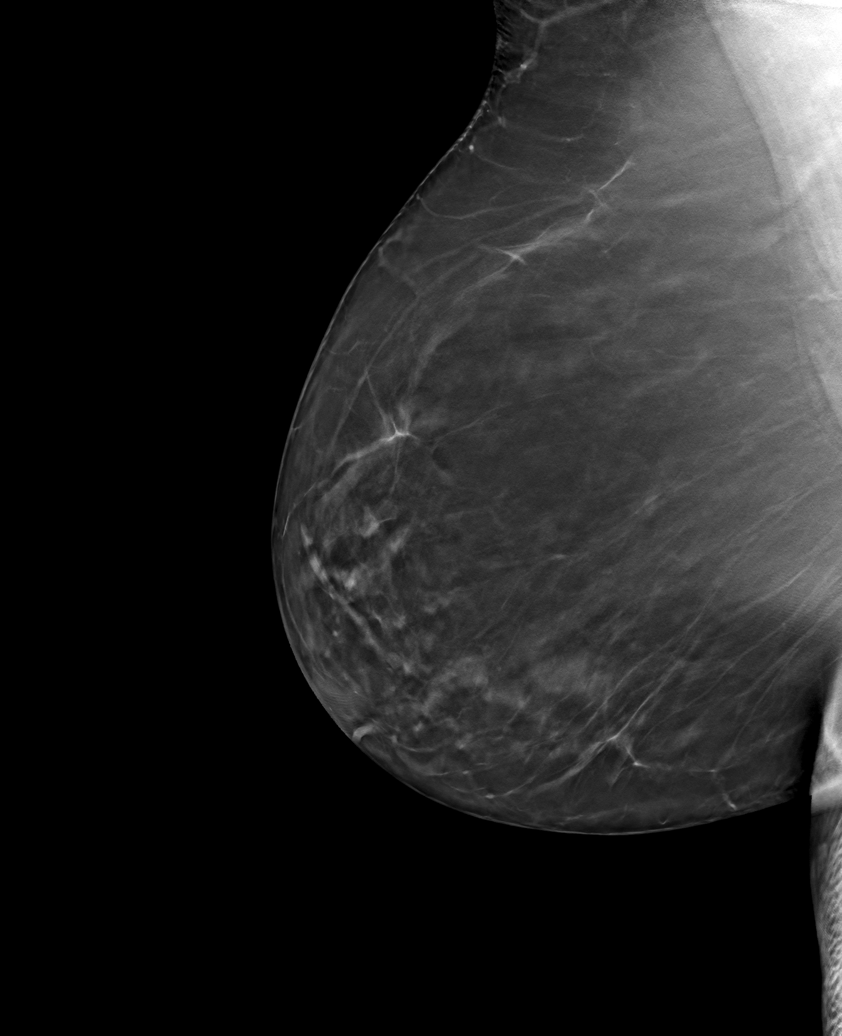

[6 of 18 positions shown; findings below may reference images not displayed]

ACR Breast Density Category b: There are scattered areas of
fibroglandular density.
FINDINGS: Mammogram:

Full field tomosynthesis views of the right breast were performed.
The previously seen circumscribed oval mass in the inferior right
breast is not definitely visualized on today's exam.

Ultrasound:

Targeted ultrasound is performed throughout the inferior aspect of
the right breast demonstrating no discrete cystic or solid mass. The
previously seen mass at 6 o'clock 2 cm from the nipple is no longer
visualized.
IMPRESSION: Resolution of the previously seen mass in the right breast at 6
o'clock. No mammographic or sonographic evidence of malignancy in
the right breast.

RECOMMENDATION:
Return to annual screening mammography which will be due in February 2020.

I have discussed the findings and recommendations with the patient.
If applicable, a reminder letter will be sent to the patient
regarding the next appointment.

BI-RADS CATEGORY  2: Benign.

## 2022-01-06 ENCOUNTER — Other Ambulatory Visit: Payer: Self-pay | Admitting: Internal Medicine

## 2022-02-05 DIAGNOSIS — H5213 Myopia, bilateral: Secondary | ICD-10-CM | POA: Diagnosis not present

## 2022-02-11 ENCOUNTER — Other Ambulatory Visit (HOSPITAL_COMMUNITY): Payer: Self-pay

## 2022-02-16 ENCOUNTER — Other Ambulatory Visit (HOSPITAL_COMMUNITY): Payer: Self-pay

## 2022-02-17 ENCOUNTER — Encounter: Payer: Self-pay | Admitting: Internal Medicine

## 2022-02-17 ENCOUNTER — Other Ambulatory Visit: Payer: Self-pay

## 2022-02-17 ENCOUNTER — Other Ambulatory Visit (HOSPITAL_COMMUNITY): Payer: Self-pay

## 2022-02-17 MED ORDER — TELMISARTAN-HCTZ 80-25 MG PO TABS
ORAL_TABLET | ORAL | 0 refills | Status: DC
  Filled 2022-02-17: qty 90, 90d supply, fill #0

## 2022-02-18 ENCOUNTER — Other Ambulatory Visit (HOSPITAL_COMMUNITY): Payer: Self-pay

## 2022-02-21 ENCOUNTER — Other Ambulatory Visit (HOSPITAL_COMMUNITY): Payer: Self-pay

## 2022-03-08 ENCOUNTER — Encounter: Payer: Self-pay | Admitting: Internal Medicine

## 2022-03-11 ENCOUNTER — Ambulatory Visit
Admission: EM | Admit: 2022-03-11 | Discharge: 2022-03-11 | Disposition: A | Discharge status: Home / Self Care | Payer: 59

## 2022-03-11 DIAGNOSIS — J0140 Acute pansinusitis, unspecified: Secondary | ICD-10-CM

## 2022-03-11 MED ORDER — PROMETHAZINE-DM 6.25-15 MG/5ML PO SYRP: 5.0000 mL | ORAL_SOLUTION | Freq: Four times a day (QID) | ORAL | 0 refills | Status: DC | PRN

## 2022-03-11 MED ORDER — DOXYCYCLINE HYCLATE 100 MG PO TABS: 100.0000 mg | ORAL_TABLET | Freq: Two times a day (BID) | ORAL | 0 refills | Status: AC

## 2022-03-11 NOTE — Discharge Instructions (Addendum)
Take medication as directed. Continue your current allergy medication regimen. Increase fluids and get plenty of rest. May take over-the-counter ibuprofen or Tylenol as needed for pain, fever, or general discomfort. Recommend normal saline nasal spray to help with nasal congestion throughout the day. For your cough, it may be helpful to use a humidifier at bedtime during sleep. If your symptoms fail to improve within the next 7 to 10 days, please follow-up with your primary care physician for reevaluation.

## 2022-03-11 NOTE — ED Triage Notes (Signed)
Pt reports sore throat, headache and sinus pressure, "teeth will fall out"  x 3 days, after been in the wind and then in the heating at home.

## 2022-03-11 NOTE — ED Provider Notes (Signed)
[facility]    CSN: [id number] Arrival date & time: 03/11/22  1450      History   Chief Complaint No chief complaint on file.   HPI Renee Alvarez is a 61 y.o. female.   The history is provided by the patient.   Patient presents with a 3 to 4-day history of headache, sinus pressure, and severe facial and tooth pain.  She states that when her symptoms started she was in cold temperatures which then changed to hot.  She states she developed postnasal drainage.  She states over the last several days, she has had worsening headache, sinus pressure, and cough.  Cough is worse at night.  She denies fever, chills, shortness of breath, difficulty breathing, or GI symptoms.  Patient reports a history of allergic rhinitis.  She states she is also taken an over-the-counter allergy medicine that was recommended by the pharmacist chlorpheniramine.  He also states that she has been taking Robitussin for her cough.  Past Medical History:  Diagnosis Date   Allergic rhinitis due to pollen    Anxiety    Arthritis    oa lumbar region   Breast cyst, right 05/23/2019   Chronic back pain    Essential (primary) hypertension    GERD (gastroesophageal reflux disease)    Gout last flare up jan 2022   Headache    Heart murmur    told years ago had mild murmur none heard recently per pt   History of hiatal hernia    Hypertension    IBS (irritable bowel syndrome)    ibsd per pt   Low back pain    Obesity, unspecified    PMB (postmenopausal bleeding)    PONV (postoperative nausea and vomiting)    slower to awkaen after cholecystectomy   Seasonal allergies    Skin irritation    both breats where bra rubs healing well using fluticasone prn per pt   Toenail fungus    left great toe using compounded med for   Wears contact lenses    Wears glasses    Zoster without complications     Patient Active Problem List   Diagnosis Date Noted   Anxiety 09/07/2021   DDD (degenerative disc  disease), lumbar 09/07/2021   Primary osteoarthritis involving multiple joints 09/07/2021   Morbid obesity with BMI of 40.0-44.9, adult ([place]) 10/08/2019   Postmenopausal bleeding 10/08/2019   Gastroesophageal reflux disease without esophagitis 05/23/2019   Allergic rhinitis due to pollen    Essential (primary) hypertension     Past Surgical History:  Procedure Laterality Date   BALLOON DILATION  05/04/2012   Procedure: BALLOON DILATION;  Surgeon: Rogene Houston, MD;  Location: [facility];  Service: Endoscopy;  Laterality: N/A;   CESAREAN SECTION  1997   CHOLECYSTECTOMY  yrs ago   laparoscopic   COLONOSCOPY WITH ESOPHAGOGASTRODUODENOSCOPY (EGD)  05/04/2012   Procedure: COLONOSCOPY WITH ESOPHAGOGASTRODUODENOSCOPY (EGD);  Surgeon: Rogene Houston, MD;  Location: [facility];  Service: Endoscopy;  Laterality: N/A;  730   Cyst removed from right finger  yrs ago   CYSTOSCOPY  08/17/2020   Procedure: CYSTOSCOPY;  Surgeon: Bobbye Charleston, MD;  Location[facility];  Service: Gynecology;;   Expoloratory Lap  [phone]   MALONEY DILATION  05/04/2012   Procedure: Venia Minks DILATION;  Surgeon: Rogene Houston, MD;  Location: [facility];  Service: Endoscopy;  Laterality: N/A;   ROBOTIC ASSISTED LAPAROSCOPIC HYSTERECTOMY AND SALPINGECTOMY Bilateral 08/17/2020  Procedure: XI ROBOTIC ASSISTED LAPAROSCOPIC HYSTERECTOMY AND SALPINGOOOPHORECTOMY;  Surgeon: Bobbye Charleston, MD;  Location: [place];  Service: Gynecology;  Laterality: Bilateral;   SAVORY DILATION  05/04/2012   Procedure: SAVORY DILATION;  Surgeon: Rogene Houston, MD;  Location: [facility];  Service: Endoscopy;  Laterality: N/A;    OB History     Gravida  2   Para  2   Term      Preterm      AB      Living  2      SAB      IAB      Ectopic      Multiple      Live Births               Home Medications    Prior to Admission medications   Medication Sig Start Date End  Date Taking? Authorizing Provider  acetaminophen (TYLENOL) 500 MG tablet Take 500 mg by mouth every 6 (six) hours as needed.   Yes [provider]  doxycycline (VIBRA-TABS) 100 MG tablet Take 1 tablet (100 mg total) by mouth 2 (two) times daily for 7 days. 03/11/22 03/18/22 Yes Burma Ketcher-Warren, Alda Lea, NP  ibuprofen (ADVIL) 200 MG tablet Take 200 mg by mouth every 6 (six) hours as needed.   Yes [provider]  promethazine-dextromethorphan (PROMETHAZINE-DM) 6.25-15 MG/5ML syrup Take 5 mLs by mouth 4 (four) times daily as needed for cough. 03/11/22  Yes Zayon Trulson-Warren, Alda Lea, NP  allopurinol (ZYLOPRIM) 300 MG tablet Take 1 tablet (300 mg total) by mouth daily. 09/09/21   Lindell Spar, MD  escitalopram (LEXAPRO) 10 MG tablet Take 1 tablet (10 mg total) by mouth daily. 09/07/21   Lindell Spar, MD  ibuprofen (ADVIL) 200 MG tablet Take 200 mg by mouth every 6 (six) hours as needed.    [provider]  ketoconazole (NIZORAL) 2 % cream Apply 1 application topically 2 (two) times daily. 08/17/20   Bobbye Charleston, MD  levocetirizine (XYZAL) 5 MG tablet Take 10 mg by mouth daily. Takes 2 of 10 mg in am per pt    [provider]  telmisartan-hydrochlorothiazide (MICARDIS HCT) 80-25 MG tablet TAKE 1 TABLET BY MOUTH ONCE DAILY. 02/17/22   Lindell Spar, MD  traMADol (ULTRAM) 50 MG tablet Take 1 tablet (50 mg total) by mouth every 8 (eight) hours as needed. 09/07/21   Lindell Spar, MD  UNABLE TO FIND Compounded formulary with terb 3 % fluc 2% teatree 5 % urea 10 % ib42 apply to left great toe 1 to 2 x daily    [provider]  Vitamin D, Ergocalciferol, (DRISDOL) 1.25 MG (50000 UNIT) CAPS capsule TAKE 1 CAPSULE BY MOUTH ONCE A WEEK. 07/29/21   Lindell Spar, MD  progesterone (PROMETRIUM) 200 MG capsule Take 200 mg by mouth at bedtime. 05/21/20 09/01/20  [provider]    Family History Family History  Problem Relation Age of Onset   Cancer Mother         breast   Breast cancer Mother 24   Cancer Father        prostate   Diabetes Sister    Hypertension Sister    Heart disease Maternal Grandfather    Other Paternal Grandfather        cerebral hemorrhage   Colon cancer Neg Hx     Social History Social History   Tobacco Use   Smoking status: Never  Smokeless tobacco: Never  Vaping Use   Vaping Use: Never used  Substance Use Topics   Alcohol use: No    Alcohol/week: 0.0 standard drinks of alcohol   Drug use: No     Allergies   Penicillins   Review of Systems Review of Systems Per HPI  Physical Exam Triage Vital Signs ED Triage Vitals [03/11/22 1551]  Enc Vitals Group     BP (!) 145/90     Pulse Rate 81     Resp 18     Temp 98.1 F (36.7 C)     Temp Source Oral     SpO2 96 %     Weight      Height      Head Circumference      Peak Flow      Pain Score      Pain Loc      Pain Edu?      Excl. in [place]?    No data found.  Updated Vital Signs BP (!) 145/90 (BP Location: Right Arm)   Pulse 81   Temp 98.1 F (36.7 C) (Oral)   Resp 18   SpO2 96%   Visual Acuity Right Eye Distance:   Left Eye Distance:   Bilateral Distance:    Right Eye Near:   Left Eye Near:    Bilateral Near:     Physical Exam Vitals and nursing note reviewed.  Constitutional:      General: She is not in acute distress.    Appearance: Normal appearance.  HENT:     Head: Normocephalic.     Right Ear: Tympanic membrane, ear canal and external ear normal.     Left Ear: Tympanic membrane, ear canal and external ear normal.     Nose: Congestion present.     Right Turbinates: Enlarged and swollen.     Left Turbinates: Enlarged and swollen.     Right Sinus: Maxillary sinus tenderness and frontal sinus tenderness present.     Left Sinus: Maxillary sinus tenderness and frontal sinus tenderness present.     Mouth/Throat:     Lips: Pink.     Mouth: Mucous membranes are moist.     Pharynx: Uvula midline. Posterior oropharyngeal  erythema present. No pharyngeal swelling, oropharyngeal exudate or uvula swelling.     Tonsils: No tonsillar exudate.     Comments: Cobblestoning seen on posterior tongue Eyes:     Extraocular Movements: Extraocular movements intact.     Conjunctiva/sclera: Conjunctivae normal.     Pupils: Pupils are equal, round, and reactive to light.  Cardiovascular:     Rate and Rhythm: Normal rate and regular rhythm.     Pulses: Normal pulses.     Heart sounds: Normal heart sounds.  Pulmonary:     Effort: Pulmonary effort is normal. No respiratory distress.     Breath sounds: Normal breath sounds. No stridor. No wheezing, rhonchi or rales.  Abdominal:     General: Bowel sounds are normal.     Palpations: Abdomen is soft.     Tenderness: There is no abdominal tenderness.  Musculoskeletal:     Cervical back: Normal range of motion.  Lymphadenopathy:     Cervical: No cervical adenopathy.  Skin:    General: Skin is warm and dry.  Neurological:     General: No focal deficit present.     Mental Status: She is alert.  Psychiatric:        Mood and Affect: Mood normal.  UC Treatments / Results  Labs (all labs ordered are listed, but only abnormal results are displayed) Labs Reviewed - No data to display  EKG   Radiology No results found.  Procedures Procedures (including critical care time)  Medications Ordered in UC Medications - No data to display  Initial Impression / Assessment and Plan / UC Course  I have reviewed the triage vital signs and the nursing notes.  Pertinent labs & imaging results that were available during my care of the patient were reviewed by me and considered in my medical decision making (see chart for details).  Patient presents for upper respiratory symptoms that started over the past 3 to 4 days.  On exam, patient's vital signs are stable, she is mildly tachycardic.  She is otherwise in no acute distress.  Patient endorses increased facial pain over the  past 3 to 4 days, when her symptoms started.  She states that she feels like her "teeth" are going to fall out.  She also has both maxillary and frontal sinus pressure.  We will treat patient for bacterial sinusitis with doxycycline.  For her cough, Promethazine DM was prescribed.  Patient advised to continue her current allergy regimen.  Supportive care recommendations were provided to the patient.  Patient verbalizes understanding.  All questions were answered.  Patient stable for discharge. Final Clinical Impressions(s) / UC Diagnoses   Final diagnoses:  Acute pansinusitis, recurrence not specified     Discharge Instructions      Take medication as directed. Continue your current allergy medication regimen. Increase fluids and get plenty of rest. May take over-the-counter ibuprofen or Tylenol as needed for pain, fever, or general discomfort. Recommend normal saline nasal spray to help with nasal congestion throughout the day. For your cough, it may be helpful to use a humidifier at bedtime during sleep. If your symptoms fail to improve within the next 7 to 10 days, please follow-up with your primary care physician for reevaluation.     ED Prescriptions     Medication Sig Dispense Auth. Provider   doxycycline (VIBRA-TABS) 100 MG tablet Take 1 tablet (100 mg total) by mouth 2 (two) times daily for 7 days. 14 tablet Glenroy Crossen-Warren, Alda Lea, NP   promethazine-dextromethorphan (PROMETHAZINE-DM) 6.25-15 MG/5ML syrup Take 5 mLs by mouth 4 (four) times daily as needed for cough. 118 mL Floretta Petro-Warren, Alda Lea, NP      PDMP not reviewed this encounter.   Tish Men, NP 03/11/22 [phone]

## 2022-03-17 ENCOUNTER — Encounter: Payer: BC Managed Care – PPO | Admitting: Internal Medicine

## 2022-03-18 ENCOUNTER — Ambulatory Visit: Payer: 59 | Admitting: Podiatry

## 2022-03-18 ENCOUNTER — Other Ambulatory Visit (HOSPITAL_COMMUNITY): Payer: Self-pay

## 2022-03-18 ENCOUNTER — Ambulatory Visit (INDEPENDENT_AMBULATORY_CARE_PROVIDER_SITE_OTHER): Payer: 59

## 2022-03-18 DIAGNOSIS — M10071 Idiopathic gout, right ankle and foot: Secondary | ICD-10-CM

## 2022-03-18 DIAGNOSIS — M778 Other enthesopathies, not elsewhere classified: Secondary | ICD-10-CM

## 2022-03-18 DIAGNOSIS — M79671 Pain in right foot: Secondary | ICD-10-CM | POA: Diagnosis not present

## 2022-03-18 MED ORDER — METHYLPREDNISOLONE 4 MG PO TBPK
ORAL_TABLET | ORAL | 0 refills | Status: AC
  Filled 2022-03-18: qty 21, 6d supply, fill #0

## 2022-03-18 NOTE — Progress Notes (Unsigned)
Subjective: Chief Complaint  Patient presents with   Gout    Right foot 5th toe started 3 days ago,    61 year old female presents the above concerns.  She states that on the lateral aspect of the right foot about 3 days ago she started noticing swelling redness and she is in a gout flare.  No injuries that she reports.  No open lesions.  No recent treatment.  Objective: AAO x3, NAD DP/PT pulses palpable bilaterally, CRT less than 3 seconds On the lateral aspect of the right foot this is more along the fifth metatarsal base there is localized edema and erythema.  There is no drainage or pus.  No open lesions.  Mild discomfort on the course of peroneal tendon.  Clinically the tendon appears to be intact.  MMT 5/5. No pain with calf compression, swelling, warmth, erythema  Assessment: Gout right foot  Plan: -All treatment options discussed with the patient including all alternatives, risks, complications.  -X-rays were obtained reviewed of the right foot.  3 views obtained.  No evidence of acute fracture or osteomyelitis.  No soft tissue edema. -Prescribed Medrol Dosepak. -Recommend drinking water, staying hydrated.  Checking uric acid level.  She is to follow with her primary care doctor significant blood work pending.  Make sure to adjust the dose of allopurinol or consider other medications. -Patient encouraged to call the office with any questions, concerns, change in symptoms.   Trula Slade DPM

## 2022-03-18 NOTE — Patient Instructions (Signed)
Gout  Gout is painful swelling of your joints. Gout is a type of arthritis. It is caused by having too much uric acid in your body. Uric acid is a chemical that is made when your body breaks down substances called purines. If your body has too much uric acid, sharp crystals can form and build up in your joints. This causes pain and swelling. Gout attacks can happen quickly and be very painful (acute gout). Over time, the attacks can affect more joints and happen more often (chronic gout). What are the causes? Gout is caused by too much uric acid in your blood. This can happen because: Your kidneys do not remove enough uric acid from your blood. Your body makes too much uric acid. You eat too many foods that are high in purines. These foods include organ meats, some seafood, and beer. Trauma or stress can bring on an attack. What increases the risk? Having a family history of gout. Being female and middle-aged. Being female and having gone through menopause. Having an organ transplant. Taking certain medicines. Having certain conditions, such as: Being very overweight (obese). Lead poisoning. Kidney disease. A skin condition called psoriasis. Other risks include: Losing weight too quickly. Not having enough water in the body (being dehydrated). Drinking alcohol, especially beer. Drinking beverages that are sweetened with a type of sugar called fructose. What are the signs or symptoms? An attack of acute gout often starts at night and usually happens in just one joint. The most common place is the big toe. Other joints that may be affected include joints of the feet, ankle, knee, fingers, wrist, or elbow. Symptoms may include: Very bad pain. Warmth. Swelling. Stiffness. Tenderness. The affected joint may be very painful to touch. Shiny, red, or purple skin. Chills and fever. Chronic gout may cause symptoms more often. More joints may be involved. You may also have white or yellow lumps  (tophi) on your hands or feet or in other areas near your joints. How is this treated? Treatment for an acute attack may include medicines for pain and swelling, such as: NSAIDs, such as ibuprofen. Steroids taken by mouth or injected into a joint. Colchicine. This can be given by mouth or through an IV tube. Treatment to prevent future attacks may include: Taking small doses of NSAIDs or colchicine daily. Using a medicine that reduces uric acid levels in your blood, such as allopurinol. Making changes to your diet. You may need to see a food expert (dietitian) about what to eat and drink to prevent gout. Follow these instructions at home: During a gout attack  If told, put ice on the painful area. To do this: Put ice in a plastic bag. Place a towel between your skin and the bag. Leave the ice on for 20 minutes, 2-3 times a day. Take off the ice if your skin turns bright red. This is very important. If you cannot feel pain, heat, or cold, you have a greater risk of damage to the area. Raise the painful joint above the level of your heart as often as you can. Rest the joint as much as possible. If the joint is in your leg, you may be given crutches. Follow instructions from your doctor about what you cannot eat or drink. Avoiding future gout attacks Eat a low-purine diet. Avoid foods and drinks such as: Liver. Kidney. Anchovies. Asparagus. Herring. Mushrooms. Mussels. Beer. Stay at a healthy weight. If you want to lose weight, talk with your doctor. Do not   lose weight too fast. Start or continue an exercise plan as told by your doctor. Eating and drinking Avoid drinks sweetened by fructose. Drink enough fluids to keep your pee (urine) pale yellow. If you drink alcohol: Limit how much you have to: 0-1 drink a day for women who are not pregnant. 0-2 drinks a day for men. Know how much alcohol is in a drink. In the [place] one drink equals one 12 oz bottle of beer (355 mL), one 5 oz  glass of wine (148 mL), or one 1 oz glass of hard liquor (44 mL). General instructions Take over-the-counter and prescription medicines only as told by your doctor. Ask your doctor if you should avoid driving or using machines while you are taking your medicine. Return to your normal activities when your doctor says that it is safe. Keep all follow-up visits. Where to find more information National Institutes of Health: www.niams.nih.gov Contact a doctor if: You have another gout attack. You still have symptoms of a gout attack after 10 days of treatment. You have problems (side effects) because of your medicines. You have chills or a fever. You have burning pain when you pee (urinate). You have pain in your lower back or belly. Get help right away if: You have very bad pain. Your pain cannot be controlled. You cannot pee. Summary Gout is painful swelling of the joints. The most common site of pain is the big toe, but it can affect other joints. Medicines and avoiding some foods can help to prevent and treat gout attacks. This information is not intended to replace advice given to you by your health care provider. Make sure you discuss any questions you have with your health care provider. Document Revised: 02/03/2021 Document Reviewed: 02/03/2021 Elsevier Patient Education  2023 [employer]  

## 2022-03-25 ENCOUNTER — Other Ambulatory Visit: Payer: Self-pay | Admitting: Internal Medicine

## 2022-03-25 ENCOUNTER — Other Ambulatory Visit (HOSPITAL_COMMUNITY): Payer: Self-pay

## 2022-03-25 ENCOUNTER — Encounter: Payer: BC Managed Care – PPO | Admitting: Internal Medicine

## 2022-03-25 DIAGNOSIS — M109 Gout, unspecified: Secondary | ICD-10-CM

## 2022-04-01 ENCOUNTER — Ambulatory Visit (INDEPENDENT_AMBULATORY_CARE_PROVIDER_SITE_OTHER): Payer: 59 | Admitting: Internal Medicine

## 2022-04-01 ENCOUNTER — Encounter: Payer: Self-pay | Admitting: Internal Medicine

## 2022-04-01 VITALS — BP 144/80 | HR 92 | Ht 67.0 in | Wt 271.8 lb

## 2022-04-01 DIAGNOSIS — Z6841 Body Mass Index (BMI) 40.0 and over, adult: Secondary | ICD-10-CM | POA: Diagnosis not present

## 2022-04-01 DIAGNOSIS — M109 Gout, unspecified: Secondary | ICD-10-CM | POA: Diagnosis not present

## 2022-04-01 DIAGNOSIS — Z0001 Encounter for general adult medical examination with abnormal findings: Secondary | ICD-10-CM | POA: Diagnosis not present

## 2022-04-01 NOTE — Progress Notes (Signed)
Complete physical exam  Patient: Renee Alvarez   DOB: [birth year]   61 y.o. Female  MRN: [id number]  Subjective:    Chief Complaint  Patient presents with   Annual Exam   Renee Alvarez is a 61 y.o. female who presents today for a complete physical exam. She reports consuming a general diet. The patient does not participate in regular exercise at present. She generally feels fairly well. She reports sleeping poorly. She does have additional problems to discuss today. She would like to discuss weight loss and requests to have her uric acid level checked today.  Most recent fall risk assessment:    04/01/2022    1:26 PM  [place] in the past year? 0  Number falls in past yr: 0  Injury with Fall? 0  Risk for fall due to : No Fall Risks  Follow up Falls evaluation completed     Most recent depression screenings:    04/01/2022    1:27 PM 09/07/2021    8:11 AM  PHQ 2/9 Scores  PHQ - 2 Score 1 0  PHQ- 9 Score 8     Vision:Within last year and Dental: No current dental problems and Receives regular dental care  Past Medical History:  Diagnosis Date   Allergic rhinitis due to pollen    Anxiety    Arthritis    oa lumbar region   Breast cyst, right 05/23/2019   Chronic back pain    Essential (primary) hypertension    GERD (gastroesophageal reflux disease)    Gout last flare up jan 2022   Headache    Heart murmur    told years ago had mild murmur none heard recently per pt   History of hiatal hernia    Hypertension    IBS (irritable bowel syndrome)    ibsd per pt   Low back pain    Obesity, unspecified    PMB (postmenopausal bleeding)    PONV (postoperative nausea and vomiting)    slower to awkaen after cholecystectomy   Seasonal allergies    Skin irritation    both breats where bra rubs healing well using fluticasone prn per pt   Toenail fungus    left great toe using compounded med for   Wears contact lenses    Wears glasses    Zoster without complications     Past Surgical History:  Procedure Laterality Date   BALLOON DILATION  05/04/2012   Procedure: BALLOON DILATION;  Surgeon: Rogene Houston, MD;  [facility];  Service: Endoscopy;  Laterality: N/A;   CESAREAN SECTION  1997   CHOLECYSTECTOMY  yrs ago   laparoscopic   COLONOSCOPY WITH ESOPHAGOGASTRODUODENOSCOPY (EGD)  05/04/2012   Procedure: COLONOSCOPY WITH ESOPHAGOGASTRODUODENOSCOPY (EGD);  Surgeon: Rogene Houston, MD;  [facility];  Service: Endoscopy;  Laterality: N/A;  730   Cyst removed from right finger  yrs ago   CYSTOSCOPY  08/17/2020   Procedure: CYSTOSCOPY;  Surgeon: Bobbye Charleston, MD;  Location: [facility];  Service: Gynecology;;   Expoloratory Lap  [phone]   MALONEY DILATION  05/04/2012   Procedure: Venia Minks DILATION;  Surgeon: Rogene Houston, MD;  [facility];  Service: Endoscopy;  Laterality: N/A;   ROBOTIC ASSISTED LAPAROSCOPIC HYSTERECTOMY AND SALPINGECTOMY Bilateral 08/17/2020   Procedure: XI ROBOTIC ASSISTED LAPAROSCOPIC HYSTERECTOMY AND SALPINGOOOPHORECTOMY;  Surgeon: Bobbye Charleston, MD;  Location: [facility];  Service: Gynecology;  Laterality: Bilateral;  SAVORY DILATION  05/04/2012   Procedure: SAVORY DILATION;  Surgeon: Rogene Houston, MD;  [facility];  Service: Endoscopy;  Laterality: N/A;   Social History   Tobacco Use   Smoking status: Never   Smokeless tobacco: Never  Vaping Use   Vaping Use: Never used  Substance Use Topics   Alcohol use: No    Alcohol/week: 0.0 standard drinks of alcohol   Drug use: No   Family History  Problem Relation Age of Onset   Cancer Mother        breast   Breast cancer Mother 107   Cancer Father        prostate   Diabetes Sister    Hypertension Sister    Heart disease Maternal Grandfather    Other Paternal Grandfather        cerebral hemorrhage   Colon cancer Neg Hx    Allergies  Allergen Reactions   Penicillins Rash    Severe  rash    Patient Care Team: Johnette Abraham, MD as PCP - General (Internal Medicine)   Outpatient Medications Prior to Visit  Medication Sig   acetaminophen (TYLENOL) 500 MG tablet Take 500 mg by mouth every 6 (six) hours as needed.   allopurinol (ZYLOPRIM) 300 MG tablet Take 1 tablet (300 mg total) by mouth daily.   Cholecalciferol (VITAMIN D) 50 MCG (2000 UT) CAPS Take 1 capsule by mouth daily.   ibuprofen (ADVIL) 200 MG tablet Take 200 mg by mouth every 6 (six) hours as needed.   ketoconazole (NIZORAL) 2 % cream Apply 1 application topically 2 (two) times daily.   levocetirizine (XYZAL) 5 MG tablet Take 10 mg by mouth daily. Takes 2 of 10 mg in am per pt   promethazine-dextromethorphan (PROMETHAZINE-DM) 6.25-15 MG/5ML syrup Take 5 mLs by mouth 4 (four) times daily as needed for cough.   telmisartan-hydrochlorothiazide (MICARDIS HCT) 80-25 MG tablet TAKE 1 TABLET BY MOUTH ONCE DAILY.   traMADol (ULTRAM) 50 MG tablet Take 1 tablet (50 mg total) by mouth every 8 (eight) hours as needed.   UNABLE TO FIND Compounded formulary with terb 3 % fluc 2% teatree 5 % urea 10 % ib42 apply to left great toe 1 to 2 x daily   escitalopram (LEXAPRO) 10 MG tablet Take 1 tablet (10 mg total) by mouth daily. (Patient not taking: Reported on 04/01/2022)   Vitamin D, Ergocalciferol, (DRISDOL) 1.25 MG (50000 UNIT) CAPS capsule TAKE 1 CAPSULE BY MOUTH ONCE A WEEK. (Patient not taking: Reported on 04/01/2022)   [DISCONTINUED] ibuprofen (ADVIL) 200 MG tablet Take 200 mg by mouth every 6 (six) hours as needed.   No facility-administered medications prior to visit.   Review of Systems  Constitutional:  Positive for malaise/fatigue.  Respiratory:  Positive for cough.   All other systems reviewed and are negative.     Objective:     BP (!) 144/80   Pulse 92   Ht _0  (1.702 m)   Wt 271 lb 12.8 oz (123.3 kg)   SpO2 97%   BMI 42.57 kg/m  BP Readings from Last 3 Encounters:  04/01/22 (!) 144/80  03/11/22  (!) 145/90  09/07/21 132/82   Physical Exam Vitals reviewed.  Constitutional:      General: She is not in acute distress.    Appearance: Normal appearance. She is obese. She is not toxic-appearing.  HENT:     Head: Normocephalic and atraumatic.     Right Ear: External ear normal.  Left Ear: External ear normal.     Nose: Nose normal. No congestion or rhinorrhea.     Mouth/Throat:     Mouth: Mucous membranes are moist.     Pharynx: Oropharynx is clear. No oropharyngeal exudate or posterior oropharyngeal erythema.  Eyes:     General: No scleral icterus.    Extraocular Movements: Extraocular movements intact.     Conjunctiva/sclera: Conjunctivae normal.     Pupils: Pupils are equal, round, and reactive to light.  Cardiovascular:     Rate and Rhythm: Normal rate and regular rhythm.     Pulses: Normal pulses.     Heart sounds: Normal heart sounds. No murmur heard.    No friction rub. No gallop.  Pulmonary:     Effort: Pulmonary effort is normal.     Breath sounds: Normal breath sounds. No wheezing, rhonchi or rales.  Abdominal:     General: Abdomen is flat. Bowel sounds are normal. There is no distension.     Palpations: Abdomen is soft.     Tenderness: There is no abdominal tenderness.  Musculoskeletal:        General: No swelling. Normal range of motion.     Cervical back: Normal range of motion.     Right lower leg: No edema.     Left lower leg: No edema.  Lymphadenopathy:     Cervical: No cervical adenopathy.  Skin:    General: Skin is warm and dry.     Capillary Refill: Capillary refill takes less than 2 seconds.     Coloration: Skin is not jaundiced.  Neurological:     General: No focal deficit present.     Mental Status: She is alert and oriented to person, place, and time.  Psychiatric:        Mood and Affect: Mood normal.        Behavior: Behavior normal.     Last CBC Lab Results  Component Value Date   WBC 7.2 09/07/2021   HGB 12.9 09/07/2021   HCT  38.1 09/07/2021   MCV 88 09/07/2021   MCH 29.8 09/07/2021   RDW 13.1 09/07/2021   PLT 227 30/11/6224   Last metabolic panel Lab Results  Component Value Date   GLUCOSE 97 09/07/2021   NA 140 09/07/2021   K 4.0 09/07/2021   CL 102 09/07/2021   CO2 23 09/07/2021   BUN 19 09/07/2021   CREATININE 1.17 (H) 09/07/2021   EGFR 52 (L) 09/07/2021   CALCIUM 9.6 09/07/2021   PROT 7.1 07/24/2020   ALBUMIN 4.5 07/24/2020   LABGLOB 2.6 07/24/2020   AGRATIO 1.7 07/24/2020   BILITOT 0.5 07/24/2020   ALKPHOS 123 (H) 07/24/2020   AST 14 07/24/2020   ALT 16 07/24/2020   ANIONGAP 9 08/14/2020   Last lipids Lab Results  Component Value Date   CHOL 173 07/24/2020   HDL 38 (L) 07/24/2020   LDLCALC 105 (H) 07/24/2020   TRIG 168 (H) 07/24/2020   CHOLHDL 4.6 (H) 07/24/2020   Last hemoglobin A1c Lab Results  Component Value Date   HGBA1C 5.6 09/07/2021   Last thyroid functions Lab Results  Component Value Date   TSH 1.070 07/24/2020   Last vitamin D Lab Results  Component Value Date   VD25OH 24.7 (L) 09/07/2021       Assessment & Plan:    Routine Health Maintenance and Physical Exam  Immunization History  Administered Date(s) Administered   Influenza,inj,Quad PF,6+ Mos 04/13/2021, 03/03/2022   Influenza-Unspecified 02/17/2017  PFIZER(Purple Top)SARS-COV-2 Vaccination 05/14/2020    Health Maintenance  Topic Date Due   Zoster Vaccines- Shingrix (1 of 2) 07/02/2022 (Originally 08/15/1979)   COLONOSCOPY (Pts 45-22yr Insurance coverage will need to be confirmed)  05/04/2022   MAMMOGRAM  08/26/2023   PAP SMEAR-Modifier  08/25/2024   INFLUENZA VACCINE  Completed   Hepatitis C Screening  Completed   HIV Screening  Completed   HPV VACCINES  Aged Out   COVID-19 Vaccine  Discontinued    Discussed health benefits of physical activity, and encouraged her to engage in regular exercise appropriate for her age and condition.  Problem List Items Addressed This Visit        Encounter for annual general medical examination with abnormal findings in adult    Presenting today for her annual exam.  Recent labs and outstanding preventative care items were reviewed. -Repeat labs ordered today and will be completed on Monday -Vaccines and cancer screenings are up-to-date -She will follow-up in 2 months for BP check and to discuss starting medication for weight loss.  In the interim, I have recommended that she begin making lifestyle modifications aimed at weight loss.      Return in about 2 months (around 06/01/2022) for HTN, Weight Loss.     PJohnette Abraham MD

## 2022-04-01 NOTE — Assessment & Plan Note (Signed)
Presenting today for her annual exam.  Recent labs and outstanding preventative care items were reviewed. -Repeat labs ordered today and will be completed on Monday -Vaccines and cancer screenings are up-to-date -She will follow-up in 2 months for BP check and to discuss starting medication for weight loss.  In the interim, I have recommended that she begin making lifestyle modifications aimed at weight loss.

## 2022-04-01 NOTE — Patient Instructions (Signed)
It was a pleasure to see you today.  Thank you for giving [place] the opportunity to be involved in your care.  Below is a brief recap of your visit and next steps.  We will plan to see you again in 2 months.  Summary You have completed your annual exam today I have ordered labs for Monday (11/20) We will follow up in 2 months to review results and discuss appropriate medications for weight loss. In the interim, I encourage you to start making lifestyle changes aimed at weight loss so that the medication will be most effective when started in January.

## 2022-04-04 ENCOUNTER — Other Ambulatory Visit: Payer: Self-pay

## 2022-04-04 DIAGNOSIS — M109 Gout, unspecified: Secondary | ICD-10-CM

## 2022-04-04 DIAGNOSIS — E559 Vitamin D deficiency, unspecified: Secondary | ICD-10-CM | POA: Diagnosis not present

## 2022-04-04 DIAGNOSIS — I10 Essential (primary) hypertension: Secondary | ICD-10-CM | POA: Diagnosis not present

## 2022-04-04 DIAGNOSIS — R7303 Prediabetes: Secondary | ICD-10-CM | POA: Diagnosis not present

## 2022-04-05 LAB — CBC WITH DIFFERENTIAL/PLATELET
Basophils Absolute: 0.1 10*3/uL (ref 0.0–0.2)
Basos: 1 %
EOS (ABSOLUTE): 0.2 10*3/uL (ref 0.0–0.4)
Eos: 3 %
Hematocrit: 37.1 % (ref 34.0–46.6)
Hemoglobin: 12.7 g/dL (ref 11.1–15.9)
Immature Grans (Abs): 0 10*3/uL (ref 0.0–0.1)
Immature Granulocytes: 0 %
Lymphocytes Absolute: 1.9 10*3/uL (ref 0.7–3.1)
Lymphs: 29 %
MCH: 29.9 pg (ref 26.6–33.0)
MCHC: 34.2 g/dL (ref 31.5–35.7)
MCV: 87 fL (ref 79–97)
Monocytes Absolute: 0.4 10*3/uL (ref 0.1–0.9)
Monocytes: 6 %
Neutrophils Absolute: 4 10*3/uL (ref 1.4–7.0)
Neutrophils: 61 %
Platelets: 239 10*3/uL (ref 150–450)
RBC: 4.25 x10E6/uL (ref 3.77–5.28)
RDW: 12.7 % (ref 11.7–15.4)
WBC: 6.6 10*3/uL (ref 3.4–10.8)

## 2022-04-05 LAB — LIPID PANEL
Chol/HDL Ratio: 4.5 ratio — ABNORMAL HIGH (ref 0.0–4.4)
Cholesterol, Total: 167 mg/dL (ref 100–199)
HDL: 37 mg/dL — ABNORMAL LOW (ref >39)
LDL Chol Calc (NIH): 107 mg/dL — ABNORMAL HIGH (ref 0–99)
Triglycerides: 130 mg/dL (ref 0–149)
VLDL Cholesterol Cal: 23 mg/dL (ref 5–40)

## 2022-04-05 LAB — HEMOGLOBIN A1C
Est. average glucose Bld gHb Est-mCnc: 128 mg/dL
Hgb A1c MFr Bld: 6.1 % — ABNORMAL HIGH (ref 4.8–5.6)

## 2022-04-05 LAB — CMP14+EGFR
ALT: 15 IU/L (ref 0–32)
AST: 12 IU/L (ref 0–40)
Albumin/Globulin Ratio: 1.6 (ref 1.2–2.2)
Albumin: 4.3 g/dL (ref 3.9–4.9)
Alkaline Phosphatase: 127 IU/L — ABNORMAL HIGH (ref 44–121)
BUN/Creatinine Ratio: 15 (ref 12–28)
BUN: 16 mg/dL (ref 8–27)
Bilirubin Total: 0.3 mg/dL (ref 0.0–1.2)
CO2: 23 mmol/L (ref 20–29)
Calcium: 10 mg/dL (ref 8.7–10.3)
Chloride: 101 mmol/L (ref 96–106)
Creatinine, Ser: 1.05 mg/dL — ABNORMAL HIGH (ref 0.57–1.00)
Globulin, Total: 2.7 g/dL (ref 1.5–4.5)
Glucose: 108 mg/dL — ABNORMAL HIGH (ref 70–99)
Potassium: 4.3 mmol/L (ref 3.5–5.2)
Sodium: 140 mmol/L (ref 134–144)
Total Protein: 7 g/dL (ref 6.0–8.5)
eGFR: 60 mL/min/{1.73_m2} (ref >59)

## 2022-04-05 LAB — B12 AND FOLATE PANEL
Folate: 5.8 ng/mL (ref >3.0)
Vitamin B-12: 543 pg/mL (ref 232–1245)

## 2022-04-05 LAB — TSH+FREE T4
Free T4: 1.29 ng/dL (ref 0.82–1.77)
TSH: 1.53 u[IU]/mL (ref 0.450–4.500)

## 2022-04-05 LAB — URIC ACID: Uric Acid: 4.9 mg/dL (ref 3.0–7.2)

## 2022-04-05 LAB — VITAMIN D 25 HYDROXY (VIT D DEFICIENCY, FRACTURES): Vit D, 25-Hydroxy: 35 ng/mL (ref 30.0–100.0)

## 2022-04-08 ENCOUNTER — Encounter: Payer: Self-pay | Admitting: Podiatry

## 2022-04-11 ENCOUNTER — Other Ambulatory Visit: Payer: Self-pay | Admitting: Podiatry

## 2022-04-11 DIAGNOSIS — M778 Other enthesopathies, not elsewhere classified: Secondary | ICD-10-CM

## 2022-04-11 DIAGNOSIS — M79671 Pain in right foot: Secondary | ICD-10-CM

## 2022-04-11 DIAGNOSIS — M10071 Idiopathic gout, right ankle and foot: Secondary | ICD-10-CM

## 2022-06-03 ENCOUNTER — Ambulatory Visit: Payer: Self-pay | Admitting: Internal Medicine

## 2022-06-17 ENCOUNTER — Ambulatory Visit: Payer: Self-pay | Admitting: Internal Medicine

## 2022-07-01 ENCOUNTER — Other Ambulatory Visit: Payer: Self-pay

## 2022-07-01 ENCOUNTER — Other Ambulatory Visit (HOSPITAL_COMMUNITY): Payer: Self-pay

## 2022-07-01 ENCOUNTER — Other Ambulatory Visit: Payer: Self-pay | Admitting: Internal Medicine

## 2022-07-01 ENCOUNTER — Encounter: Payer: Self-pay | Admitting: Internal Medicine

## 2022-07-01 ENCOUNTER — Ambulatory Visit: Payer: Commercial Managed Care - PPO | Admitting: Internal Medicine

## 2022-07-01 DIAGNOSIS — I10 Essential (primary) hypertension: Secondary | ICD-10-CM | POA: Diagnosis not present

## 2022-07-01 DIAGNOSIS — Z6841 Body Mass Index (BMI) 40.0 and over, adult: Secondary | ICD-10-CM | POA: Diagnosis not present

## 2022-07-01 DIAGNOSIS — R7303 Prediabetes: Secondary | ICD-10-CM

## 2022-07-01 DIAGNOSIS — F419 Anxiety disorder, unspecified: Secondary | ICD-10-CM

## 2022-07-01 MED ORDER — TELMISARTAN-HCTZ 80-25 MG PO TABS
1.0000 | ORAL_TABLET | Freq: Every day | ORAL | 0 refills | Status: DC
  Filled 2022-07-01: qty 90, 90d supply, fill #0

## 2022-07-01 MED ORDER — SEMAGLUTIDE-WEIGHT MANAGEMENT 0.5 MG/0.5ML ~~LOC~~ SOAJ
0.5000 mg | SUBCUTANEOUS | 0 refills | Status: DC
  Filled 2022-07-01: qty 2, 28d supply, fill #0

## 2022-07-01 MED ORDER — SEMAGLUTIDE-WEIGHT MANAGEMENT 0.25 MG/0.5ML ~~LOC~~ SOAJ
0.2500 mg | SUBCUTANEOUS | 0 refills | Status: DC
  Filled 2022-07-01: qty 2, 28d supply, fill #0

## 2022-07-01 MED ORDER — ZEPBOUND 2.5 MG/0.5ML ~~LOC~~ SOAJ
2.5000 mg | SUBCUTANEOUS | 0 refills | Status: AC
  Filled 2022-07-01: qty 2, 28d supply, fill #0

## 2022-07-01 MED ORDER — HYDROXYZINE PAMOATE 25 MG PO CAPS
25.0000 mg | ORAL_CAPSULE | Freq: Three times a day (TID) | ORAL | 0 refills | Status: DC | PRN
  Filled 2022-07-01: qty 30, 10d supply, fill #0

## 2022-07-01 NOTE — Patient Instructions (Signed)
It was a pleasure to see you today.  Thank you for giving [place] the opportunity to be involved in your care.  Below is a brief recap of your visit and next steps.  We will plan to see you again in 6 weeks.  Summary Start Wegovy 0.25 mg weekly I have prescribed hydroxyzine for as needed anxiety relief We will plan for follow up in 6 weeks

## 2022-07-01 NOTE — Progress Notes (Signed)
Established Patient Office Visit  Subjective   Patient ID: SI FORBES, female    DOB: 02/21/1961  Age: 62 y.o. MRN: [id number]  Chief Complaint  Patient presents with   Blood Pressure Check    Follow up   Ms. Nails returns to care today for routine follow-up.  She was last seen by me on 04/01/22 for her annual exam.  At that time her blood pressure was elevated and she wanted to discuss starting a weight loss for medication.  29-monthfollow-up was arranged.  There have been no acute interval events.  Ms. TPiermanreports feeling well today.  She endorses recent anxiety and stress at home.  She is interested in an as needed medication for anxiety relief.  She is otherwise asymptomatic and has no additional concerns to discuss.  Past Medical History:  Diagnosis Date   Allergic rhinitis due to pollen    Anxiety    Arthritis    oa lumbar region   Breast cyst, right 05/23/2019   Chronic back pain    Essential (primary) hypertension    GERD (gastroesophageal reflux disease)    Gout last flare up jan 2022   Headache    Heart murmur    told years ago had mild murmur none heard recently per pt   History of hiatal hernia    Hypertension    IBS (irritable bowel syndrome)    ibsd per pt   Low back pain    Obesity, unspecified    PMB (postmenopausal bleeding)    PONV (postoperative nausea and vomiting)    slower to awkaen after cholecystectomy   Seasonal allergies    Skin irritation    both breats where bra rubs healing well using fluticasone prn per pt   Toenail fungus    left great toe using compounded med for   Wears contact lenses    Wears glasses    Zoster without complications    Past Surgical History:  Procedure Laterality Date   BALLOON DILATION  05/04/2012   Procedure: BALLOON DILATION;  Surgeon: NRogene Houston MD;  [facility];  Service: Endoscopy;  Laterality: N/A;   CESAREAN SECTION  1997   CHOLECYSTECTOMY  yrs ago   laparoscopic   COLONOSCOPY WITH  ESOPHAGOGASTRODUODENOSCOPY (EGD)  05/04/2012   Procedure: COLONOSCOPY WITH ESOPHAGOGASTRODUODENOSCOPY (EGD);  Surgeon: NRogene Houston MD;  [facility];  Service: Endoscopy;  Laterality: N/A;  730   Cyst removed from right finger  yrs ago   CYSTOSCOPY  08/17/2020   Procedure: CYSTOSCOPY;  Surgeon: HBobbye Charleston MD;  [facility]  Service: Gynecology;;   Expoloratory Lap  [phone]  MALONEY DILATION  05/04/2012   Procedure: MVenia MinksDILATION;  Surgeon: NRogene Houston MD;  [facility];  Service: Endoscopy;  Laterality: N/A;   ROBOTIC ASSISTED LAPAROSCOPIC HYSTERECTOMY AND SALPINGECTOMY Bilateral 08/17/2020   Procedure: XI ROBOTIC ASSISTED LAPAROSCOPIC HYSTERECTOMY AND SALPINGOOOPHORECTOMY;  Surgeon: HBobbye Charleston MD;  [place] [facility]  Service: Gynecology;  Laterality: Bilateral;   SAVORY DILATION  05/04/2012   Procedure: SAVORY DILATION;  Surgeon: NRogene Houston MD;  [facility];  Service: Endoscopy;  Laterality: N/A;   Social History   Tobacco Use   Smoking status: Never   Smokeless tobacco: Never  Vaping Use   Vaping Use: Never used  Substance Use Topics   Alcohol use: No    Alcohol/week: 0.0 standard drinks of alcohol   Drug  use: No   Family History  Problem Relation Age of Onset   Cancer Mother        breast   Breast cancer Mother 4   Cancer Father        prostate   Diabetes Sister    Hypertension Sister    Heart disease Maternal Grandfather    Other Paternal Grandfather        cerebral hemorrhage   Colon cancer Neg Hx    Allergies  Allergen Reactions   Penicillins Rash    Severe rash   Review of Systems  Psychiatric/Behavioral:  The patient is nervous/anxious.   All other systems reviewed and are negative.    Objective:     BP 137/76   Pulse 83   Ht 5' 7"$  (1.702 m)   Wt 272 lb 6.4 oz (123.6 kg)   SpO2 98%   BMI 42.66 kg/m  BP Readings from Last 3 Encounters:   07/01/22 137/76  04/01/22 (!) 144/80  03/11/22 (!) 145/90   Physical Exam Vitals reviewed.  Constitutional:      General: She is not in acute distress.    Appearance: Normal appearance. She is obese. She is not toxic-appearing.  HENT:     Head: Normocephalic and atraumatic.     Right Ear: External ear normal.     Left Ear: External ear normal.     Nose: Nose normal. No congestion or rhinorrhea.     Mouth/Throat:     Mouth: Mucous membranes are moist.     Pharynx: Oropharynx is clear. No oropharyngeal exudate or posterior oropharyngeal erythema.  Eyes:     General: No scleral icterus.    Extraocular Movements: Extraocular movements intact.     Conjunctiva/sclera: Conjunctivae normal.     Pupils: Pupils are equal, round, and reactive to light.  Cardiovascular:     Rate and Rhythm: Normal rate and regular rhythm.     Pulses: Normal pulses.     Heart sounds: Normal heart sounds. No murmur heard.    No friction rub. No gallop.  Pulmonary:     Effort: Pulmonary effort is normal.     Breath sounds: Normal breath sounds. No wheezing, rhonchi or rales.  Abdominal:     General: Abdomen is flat. Bowel sounds are normal. There is no distension.     Palpations: Abdomen is soft.     Tenderness: There is no abdominal tenderness.  Musculoskeletal:        General: No swelling. Normal range of motion.     Cervical back: Normal range of motion.     Right lower leg: No edema.     Left lower leg: No edema.  Lymphadenopathy:     Cervical: No cervical adenopathy.  Skin:    General: Skin is warm and dry.     Capillary Refill: Capillary refill takes less than 2 seconds.     Coloration: Skin is not jaundiced.  Neurological:     General: No focal deficit present.     Mental Status: She is alert and oriented to person, place, and time.  Psychiatric:        Mood and Affect: Mood normal.        Behavior: Behavior normal.   Last CBC Lab Results  Component Value Date   WBC 6.6 04/04/2022    HGB 12.7 04/04/2022   HCT 37.1 04/04/2022   MCV 87 04/04/2022   MCH 29.9 04/04/2022   RDW 12.7 04/04/2022   PLT 239 04/04/2022  Last metabolic panel Lab Results  Component Value Date   GLUCOSE 108 (H) 04/04/2022   NA 140 04/04/2022   K 4.3 04/04/2022   CL 101 04/04/2022   CO2 23 04/04/2022   BUN 16 04/04/2022   CREATININE 1.05 (H) 04/04/2022   EGFR 60 04/04/2022   CALCIUM 10.0 04/04/2022   PROT 7.0 04/04/2022   ALBUMIN 4.3 04/04/2022   LABGLOB 2.7 04/04/2022   AGRATIO 1.6 04/04/2022   BILITOT 0.3 04/04/2022   ALKPHOS 127 (H) 04/04/2022   AST 12 04/04/2022   ALT 15 04/04/2022   ANIONGAP 9 08/14/2020   Last lipids Lab Results  Component Value Date   CHOL 167 04/04/2022   HDL 37 (L) 04/04/2022   LDLCALC 107 (H) 04/04/2022   TRIG 130 04/04/2022   CHOLHDL 4.5 (H) 04/04/2022   Last hemoglobin A1c Lab Results  Component Value Date   HGBA1C 6.1 (H) 04/04/2022   Last thyroid functions Lab Results  Component Value Date   TSH 1.530 04/04/2022   Last vitamin D Lab Results  Component Value Date   VD25OH 35.0 04/04/2022   Last vitamin B12 and Folate Lab Results  Component Value Date   VITAMINB12 543 04/04/2022   FOLATE 5.8 04/04/2022   The 10-year ASCVD risk score (Arnett DK, et al., 2019) is: 7.6%    Assessment & Plan:   Problem List Items Addressed This Visit       Essential (primary) hypertension    She is currently prescribed telmisartan-HCTZ.  Her blood pressure today is 137/76. -No medication changes today      Morbid obesity with BMI of 40.0-44.9, adult ([place]) - Primary    BMI 42.6.  She is interested in starting medication for weight loss as she has attempted to make lifestyle modifications in depth weight loss without sustained success. -Wegovy 0.25 mg weekly has been prescribed today -We will tentatively plan for follow-up in 6 weeks to assess progress      Anxiety    Previously prescribed Lexapro for anxiety but has been off of this  medication for several months.  She endorses recent anxiety related to stress at home.  She is interested in an as needed medication for anxiety relief. -Hydroxyzine 25 mg as needed has been prescribed today      Return in about 6 weeks (around 08/12/2022).    Johnette Abraham, MD

## 2022-07-01 NOTE — Assessment & Plan Note (Signed)
Previously prescribed Lexapro for anxiety but has been off of this medication for several months.  She endorses recent anxiety related to stress at home.  She is interested in an as needed medication for anxiety relief. -Hydroxyzine 25 mg as needed has been prescribed today

## 2022-07-01 NOTE — Assessment & Plan Note (Signed)
She is currently prescribed telmisartan-HCTZ.  Her blood pressure today is 137/76. -No medication changes today

## 2022-07-01 NOTE — Assessment & Plan Note (Signed)
BMI 42.6.  She is interested in starting medication for weight loss as she has attempted to make lifestyle modifications in depth weight loss without sustained success. -Wegovy 0.25 mg weekly has been prescribed today -We will tentatively plan for follow-up in 6 weeks to assess progress

## 2022-07-02 ENCOUNTER — Other Ambulatory Visit (HOSPITAL_COMMUNITY): Payer: Self-pay

## 2022-07-04 ENCOUNTER — Other Ambulatory Visit: Payer: Self-pay

## 2022-07-04 ENCOUNTER — Other Ambulatory Visit (HOSPITAL_COMMUNITY): Payer: Self-pay

## 2022-07-05 ENCOUNTER — Other Ambulatory Visit (HOSPITAL_COMMUNITY): Payer: Self-pay

## 2022-07-05 ENCOUNTER — Other Ambulatory Visit: Payer: Self-pay

## 2022-07-09 ENCOUNTER — Other Ambulatory Visit: Payer: Self-pay

## 2022-07-09 ENCOUNTER — Ambulatory Visit
Admission: EM | Admit: 2022-07-09 | Discharge: 2022-07-09 | Disposition: A | Discharge status: Home / Self Care | Payer: Commercial Managed Care - PPO | Attending: Family Medicine | Admitting: Family Medicine

## 2022-07-09 ENCOUNTER — Encounter: Payer: Self-pay | Admitting: Emergency Medicine

## 2022-07-09 ENCOUNTER — Ambulatory Visit (INDEPENDENT_AMBULATORY_CARE_PROVIDER_SITE_OTHER): Payer: Commercial Managed Care - PPO

## 2022-07-09 DIAGNOSIS — R059 Cough, unspecified: Secondary | ICD-10-CM

## 2022-07-09 DIAGNOSIS — R062 Wheezing: Secondary | ICD-10-CM

## 2022-07-09 DIAGNOSIS — J22 Unspecified acute lower respiratory infection: Secondary | ICD-10-CM | POA: Diagnosis not present

## 2022-07-09 MED ORDER — PREDNISONE 20 MG PO TABS: 40.0000 mg | ORAL_TABLET | Freq: Every day | ORAL | 0 refills | Status: DC

## 2022-07-09 MED ORDER — ALBUTEROL SULFATE HFA 108 (90 BASE) MCG/ACT IN AERS: 2.0000 | INHALATION_SPRAY | RESPIRATORY_TRACT | 0 refills | Status: DC | PRN

## 2022-07-09 MED ORDER — PROMETHAZINE-DM 6.25-15 MG/5ML PO SYRP: 5.0000 mL | ORAL_SOLUTION | Freq: Four times a day (QID) | ORAL | 0 refills | Status: DC | PRN

## 2022-07-09 MED ORDER — AZITHROMYCIN 250 MG PO TABS: ORAL_TABLET | ORAL | 0 refills | Status: DC

## 2022-07-09 MED ORDER — ALBUTEROL SULFATE (2.5 MG/3ML) 0.083% IN NEBU
2.5000 mg | INHALATION_SOLUTION | Freq: Once | RESPIRATORY_TRACT | Status: AC
  Administered 2022-07-09: 2.5 mg via RESPIRATORY_TRACT

## 2022-07-09 NOTE — ED Provider Notes (Signed)
[facility]    CSN: [id number] Arrival date & time: 07/09/22  1222      History   Chief Complaint Chief Complaint  Patient presents with   Cough    HPI Renee Alvarez is a 62 y.o. female.   Patient presenting today with about a week of progressively worsening bilateral ear pressure, sinus pain and pressure, nasal congestion, body aches, productive cough, wheezing, shortness of breath.  Denies fever, chills, chest pain, abdominal pain, nausea vomiting or diarrhea.  So far trying numerous over-the-counter remedies, no benefit and feels symptoms are progressively worsening.  Does have a history of seasonal allergies on a as needed regimen for this.  Denies any known history of chronic pulmonary disease.    Past Medical History:  Diagnosis Date   Allergic rhinitis due to pollen    Anxiety    Arthritis    oa lumbar region   Breast cyst, right 05/23/2019   Chronic back pain    Essential (primary) hypertension    GERD (gastroesophageal reflux disease)    Gout last flare up jan 2022   Headache    Heart murmur    told years ago had mild murmur none heard recently per pt   History of hiatal hernia    Hypertension    IBS (irritable bowel syndrome)    ibsd per pt   Low back pain    Obesity, unspecified    PMB (postmenopausal bleeding)    PONV (postoperative nausea and vomiting)    slower to awkaen after cholecystectomy   Seasonal allergies    Skin irritation    both breats where bra rubs healing well using fluticasone prn per pt   Toenail fungus    left great toe using compounded med for   Wears contact lenses    Wears glasses    Zoster without complications     Patient Active Problem List   Diagnosis Date Noted   Encounter for annual general medical examination with abnormal findings in adult 04/01/2022   Anxiety 09/07/2021   DDD (degenerative disc disease), lumbar 09/07/2021   Primary osteoarthritis involving multiple joints 09/07/2021   Morbid obesity with  BMI of 40.0-44.9, adult ([place]) 10/08/2019   Postmenopausal bleeding 10/08/2019   Gastroesophageal reflux disease without esophagitis 05/23/2019   Allergic rhinitis due to pollen    Essential (primary) hypertension     Past Surgical History:  Procedure Laterality Date   BALLOON DILATION  05/04/2012   Procedure: BALLOON DILATION;  Surgeon: Rogene Houston, MD;  [facility];  Service: Endoscopy;  Laterality: N/A;   CESAREAN SECTION  1997   CHOLECYSTECTOMY  yrs ago   laparoscopic   COLONOSCOPY WITH ESOPHAGOGASTRODUODENOSCOPY (EGD)  05/04/2012   Procedure: COLONOSCOPY WITH ESOPHAGOGASTRODUODENOSCOPY (EGD);  Surgeon: Rogene Houston, MD;  [facility];  Service: Endoscopy;  Laterality: N/A;  730   Cyst removed from right finger  yrs ago   CYSTOSCOPY  08/17/2020   Procedure: CYSTOSCOPY;  Surgeon: Bobbye Charleston, MD;  Location: [facility];  Service: Gynecology;;   Expoloratory Lap  [phone]   MALONEY DILATION  05/04/2012   Procedure: Venia Minks DILATION;  Surgeon: Rogene Houston, MD;  [facility];  Service: Endoscopy;  Laterality: N/A;   ROBOTIC ASSISTED LAPAROSCOPIC HYSTERECTOMY AND SALPINGECTOMY Bilateral 08/17/2020   Procedure: XI ROBOTIC ASSISTED LAPAROSCOPIC HYSTERECTOMY AND SALPINGOOOPHORECTOMY;  Surgeon: Bobbye Charleston, MD;  Location: [facility];  Service: Gynecology;  Laterality: Bilateral;   SAVORY  DILATION  05/04/2012   Procedure: SAVORY DILATION;  Surgeon: Rogene Houston, MD;  [facility];  Service: Endoscopy;  Laterality: N/A;    OB History     Gravida  2   Para  2   Term      Preterm      AB      Living  2      SAB      IAB      Ectopic      Multiple      Live Births               Home Medications    Prior to Admission medications   Medication Sig Start Date End Date Taking? Authorizing Provider  albuterol (VENTOLIN HFA) 108 (90 Base) MCG/ACT inhaler Inhale 2 puffs into  the lungs every 4 (four) hours as needed for wheezing or shortness of breath. 07/09/22  Yes Volney American, PA-C  azithromycin (ZITHROMAX) 250 MG tablet Take first 2 tablets together, then 1 every day until finished. 07/09/22  Yes Volney American, PA-C  predniSONE (DELTASONE) 20 MG tablet Take 2 tablets (40 mg total) by mouth daily with breakfast. 07/09/22  Yes Volney American, PA-C  promethazine-dextromethorphan (PROMETHAZINE-DM) 6.25-15 MG/5ML syrup Take 5 mLs by mouth 4 (four) times daily as needed. 07/09/22  Yes Volney American, PA-C  tirzepatide [facility]) 2.5 MG/0.5ML Pen Inject 2.5 mg into the skin once a week for 28 days. 07/01/22 07/30/22  Johnette Abraham, MD  acetaminophen (TYLENOL) 500 MG tablet Take 500 mg by mouth every 6 (six) hours as needed.    [provider]  allopurinol (ZYLOPRIM) 300 MG tablet Take 1 tablet (300 mg total) by mouth daily. 03/28/22   Lindell Spar, MD  Cholecalciferol (VITAMIN D) 50 MCG (2000 UT) CAPS Take 1 capsule by mouth daily.    [provider]  hydrOXYzine (VISTARIL) 25 MG capsule Take 1 capsule (25 mg total) by mouth every 8 (eight) hours as needed. 07/01/22   Johnette Abraham, MD  ibuprofen (ADVIL) 200 MG tablet Take 200 mg by mouth every 6 (six) hours as needed.    [provider]  ketoconazole (NIZORAL) 2 % cream Apply 1 application topically 2 (two) times daily. 08/17/20   Bobbye Charleston, MD  levocetirizine (XYZAL) 5 MG tablet Take 10 mg by mouth daily. Takes 2 of 10 mg in am per pt    [provider]  promethazine-dextromethorphan (PROMETHAZINE-DM) 6.25-15 MG/5ML syrup Take 5 mLs by mouth 4 (four) times daily as needed for cough. 03/11/22   Leath-Warren, Alda Lea, NP  telmisartan-hydrochlorothiazide (MICARDIS HCT) 80-25 MG tablet Take 1 tablet by mouth daily. 07/01/22   Johnette Abraham, MD  UNABLE TO FIND Compounded formulary with terb 3 % fluc 2% teatree 5 % urea 10 % ib42 apply to left  great toe 1 to 2 x daily    [provider]  progesterone (PROMETRIUM) 200 MG capsule Take 200 mg by mouth at bedtime. 05/21/20 09/01/20  [provider]    Family History Family History  Problem Relation Age of Onset   Cancer Mother        breast   Breast cancer Mother 60   Cancer Father        prostate   Diabetes Sister    Hypertension Sister    Heart disease Maternal Grandfather    Other Paternal Grandfather        cerebral hemorrhage  Colon cancer Neg Hx     Social History Social History   Tobacco Use   Smoking status: Never   Smokeless tobacco: Never  Vaping Use   Vaping Use: Never used  Substance Use Topics   Alcohol use: No    Alcohol/week: 0.0 standard drinks of alcohol   Drug use: No     Allergies   Penicillins   Review of Systems Review of Systems Per HPI  Physical Exam Triage Vital Signs ED Triage Vitals  Enc Vitals Group     BP 07/09/22 1234 122/76     Pulse Rate 07/09/22 1234 94     Resp 07/09/22 1234 20     Temp 07/09/22 1234 98.9 F (37.2 C)     Temp Source 07/09/22 1234 Oral     SpO2 07/09/22 1234 96 %     Weight --      Height --      Head Circumference --      Peak Flow --      Pain Score 07/09/22 1231 6     Pain Loc --      Pain Edu? --      Excl. in [place]? --    No data found.  Updated Vital Signs BP 122/76 (BP Location: Right Arm)   Pulse 94   Temp 98.9 F (37.2 C) (Oral)   Resp 20   SpO2 96%   Visual Acuity Right Eye Distance:   Left Eye Distance:   Bilateral Distance:    Right Eye Near:   Left Eye Near:    Bilateral Near:     Physical Exam Vitals and nursing note reviewed.  Constitutional:      Appearance: Normal appearance.  HENT:     Head: Atraumatic.     Right Ear: Tympanic membrane and external ear normal.     Left Ear: Tympanic membrane and external ear normal.     Nose: Congestion present.     Mouth/Throat:     Mouth: Mucous membranes are moist.     Pharynx: Posterior oropharyngeal  erythema present.  Eyes:     Extraocular Movements: Extraocular movements intact.     Conjunctiva/sclera: Conjunctivae normal.  Cardiovascular:     Rate and Rhythm: Normal rate and regular rhythm.     Heart sounds: Normal heart sounds.  Pulmonary:     Effort: Pulmonary effort is normal. No respiratory distress.     Breath sounds: Wheezing present.  Musculoskeletal:        General: Normal range of motion.     Cervical back: Normal range of motion and neck supple.  Skin:    General: Skin is warm and dry.  Neurological:     Mental Status: Renee Alvarez is alert and oriented to person, place, and time.  Psychiatric:        Mood and Affect: Mood normal.        Thought Content: Thought content normal.      UC Treatments / Results  Labs (all labs ordered are listed, but only abnormal results are displayed) Labs Reviewed - No data to display  EKG   Radiology DG Chest 2 View  Result Date: 07/09/2022 CLINICAL DATA:  Cough and wheezing for 6 days. EXAM: CHEST - 2 VIEW COMPARISON:  None Available. FINDINGS: Cardiac silhouette and mediastinal contours are within normal limits. The lungs are clear. No pleural effusion or pneumothorax. Mild multilevel degenerative disc changes of the thoracic spine. IMPRESSION: No active cardiopulmonary disease. Electronically Signed  By: Yvonne Kendall M.D.   On: 07/09/2022 13:46    Procedures Procedures (including critical care time)  Medications Ordered in UC Medications  albuterol (PROVENTIL) (2.5 MG/3ML) 0.083% nebulizer solution 2.5 mg (2.5 mg Nebulization Given 07/09/22 1328)    Initial Impression / Assessment and Plan / UC Course  I have reviewed the triage vital signs and the nursing notes.  Pertinent labs & imaging results that were available during my care of the patient were reviewed by me and considered in my medical decision making (see chart for details).     Vital signs reassuring today, exam with significant wheezes bilaterally not well  improved after albuterol nebulizer treatment in office.  Chest x-ray was negative for acute cardiopulmonary abnormality.  Do suspect upper respiratory infection now developing into a lower respiratory infection.  Will treat with prednisone, azithromycin, Phenergan DM, albuterol, Mucinex and other supportive medications over-the-counter.  Discussed supportive home care and return precautions.  Final Clinical Impressions(s) / UC Diagnoses   Final diagnoses:  Lower respiratory infection  Wheezing   Discharge Instructions   None    ED Prescriptions     Medication Sig Dispense Auth. Provider   azithromycin (ZITHROMAX) 250 MG tablet Take first 2 tablets together, then 1 every day until finished. 6 tablet Volney American, [place]   predniSONE (DELTASONE) 20 MG tablet Take 2 tablets (40 mg total) by mouth daily with breakfast. 10 tablet Volney American, PA-C   albuterol (VENTOLIN HFA) 108 (90 Base) MCG/ACT inhaler Inhale 2 puffs into the lungs every 4 (four) hours as needed for wheezing or shortness of breath. [place], [place]   promethazine-dextromethorphan (PROMETHAZINE-DM) 6.25-15 MG/5ML syrup Take 5 mLs by mouth 4 (four) times daily as needed. 100 mL Volney American, [place]      PDMP not reviewed this encounter.   Volney American, [place] 07/09/22 1402

## 2022-07-09 NOTE — ED Triage Notes (Addendum)
Pt reports cough with intermittent production and wheezing since Monday. Pt reports bilateral ear pressure, body aches. Denies any known fevers.  Has tried otc medication with no change in symptoms.

## 2022-07-11 ENCOUNTER — Other Ambulatory Visit: Payer: Self-pay

## 2022-07-11 ENCOUNTER — Other Ambulatory Visit: Payer: Self-pay | Admitting: Internal Medicine

## 2022-07-11 ENCOUNTER — Other Ambulatory Visit (HOSPITAL_COMMUNITY): Payer: Self-pay

## 2022-07-11 DIAGNOSIS — M109 Gout, unspecified: Secondary | ICD-10-CM

## 2022-07-11 MED ORDER — ALLOPURINOL 300 MG PO TABS
300.0000 mg | ORAL_TABLET | Freq: Every day | ORAL | 0 refills | Status: DC
  Filled 2022-07-11: qty 90, 90d supply, fill #0

## 2022-07-13 ENCOUNTER — Other Ambulatory Visit (HOSPITAL_COMMUNITY): Payer: Self-pay

## 2022-08-05 ENCOUNTER — Other Ambulatory Visit: Payer: Self-pay | Admitting: Internal Medicine

## 2022-08-05 ENCOUNTER — Other Ambulatory Visit: Payer: Self-pay

## 2022-08-05 ENCOUNTER — Other Ambulatory Visit (HOSPITAL_COMMUNITY): Payer: Self-pay

## 2022-08-05 MED ORDER — ZEPBOUND 5 MG/0.5ML ~~LOC~~ SOAJ: 5.0000 mg | SUBCUTANEOUS | 0 refills | Status: DC

## 2022-08-08 ENCOUNTER — Other Ambulatory Visit: Payer: Self-pay

## 2022-08-08 MED ORDER — ZEPBOUND 5 MG/0.5ML ~~LOC~~ SOAJ
5.0000 mg | SUBCUTANEOUS | 0 refills | Status: DC
  Filled 2022-08-08: qty 2, 28d supply, fill #0

## 2022-08-11 ENCOUNTER — Other Ambulatory Visit (HOSPITAL_COMMUNITY): Payer: Self-pay

## 2022-08-12 ENCOUNTER — Ambulatory Visit: Payer: Commercial Managed Care - PPO | Admitting: Internal Medicine

## 2022-08-15 ENCOUNTER — Other Ambulatory Visit (HOSPITAL_COMMUNITY): Payer: Self-pay

## 2022-08-26 ENCOUNTER — Other Ambulatory Visit: Payer: Self-pay

## 2022-08-26 ENCOUNTER — Ambulatory Visit: Payer: Commercial Managed Care - PPO | Admitting: Internal Medicine

## 2022-08-26 ENCOUNTER — Encounter: Payer: Self-pay | Admitting: Internal Medicine

## 2022-08-26 DIAGNOSIS — Z6841 Body Mass Index (BMI) 40.0 and over, adult: Secondary | ICD-10-CM | POA: Diagnosis not present

## 2022-08-26 DIAGNOSIS — R7303 Prediabetes: Secondary | ICD-10-CM

## 2022-08-26 MED ORDER — ZEPBOUND 7.5 MG/0.5ML ~~LOC~~ SOAJ
7.5000 mg | SUBCUTANEOUS | 2 refills | Status: AC
  Filled 2022-08-26 – 2022-09-06 (×2): qty 2, 28d supply, fill #0
  Filled 2022-10-24: qty 2, 28d supply, fill #1

## 2022-08-26 MED ORDER — ZEPBOUND 7.5 MG/0.5ML ~~LOC~~ SOAJ
7.5000 mg | SUBCUTANEOUS | 0 refills | Status: DC
  Filled 2022-08-26: qty 2, 28d supply, fill #0

## 2022-08-26 NOTE — Progress Notes (Unsigned)
Established Patient Office Visit  Subjective   Patient ID: Renee Alvarez, female    DOB: 08/05/1960  Age: 62 y.o. MRN: [id number]  Chief Complaint  Patient presents with   Weight Loss    Follow up   Renee Alvarez returns to care today for obesity follow-up.  She was last evaluated by me on 2/16 at which time [facility] was prescribed for weight loss in the setting of morbid obesity.  She was ultimately started on Zepbound.  In the interim she presented to the emergency department on 2/24 endorsing upper respiratory symptoms.  There have otherwise been no acute interval events.  Renee Alvarez reports feeling well today.  She is asymptomatic and has no additional concerns to discuss.  Her weight today is 257 pounds, which is down 15 pounds from her last appointment.  She has not experienced any adverse side effects since starting Zepbound.  Past Medical History:  Diagnosis Date   Allergic rhinitis due to pollen    Anxiety    Arthritis    oa lumbar region   Breast cyst, right 05/23/2019   Chronic back pain    Essential (primary) hypertension    GERD (gastroesophageal reflux disease)    Gout last flare up jan 2022   Headache    Heart murmur    told years ago had mild murmur none heard recently per pt   History of hiatal hernia    Hypertension    IBS (irritable bowel syndrome)    ibsd per pt   Low back pain    Obesity, unspecified    PMB (postmenopausal bleeding)    PONV (postoperative nausea and vomiting)    slower to awkaen after cholecystectomy   Seasonal allergies    Skin irritation    both breats where bra rubs healing well using fluticasone prn per pt   Toenail fungus    left great toe using compounded med for   Wears contact lenses    Wears glasses    Zoster without complications    Past Surgical History:  Procedure Laterality Date   BALLOON DILATION  05/04/2012   Procedure: BALLOON DILATION;  Surgeon: Malissa Hippo, MD;  [facility];  Service: Endoscopy;  Laterality:  N/A;   CESAREAN SECTION  1997   CHOLECYSTECTOMY  yrs ago   laparoscopic   COLONOSCOPY WITH ESOPHAGOGASTRODUODENOSCOPY (EGD)  05/04/2012   Procedure: COLONOSCOPY WITH ESOPHAGOGASTRODUODENOSCOPY (EGD);  Surgeon: Malissa Hippo, MD;  [facility];  Service: Endoscopy;  Laterality: N/A;  730   Cyst removed from right finger  yrs ago   CYSTOSCOPY  08/17/2020   Procedure: CYSTOSCOPY;  Surgeon: Carrington Clamp, MD;  Location: [facility];  Service: Gynecology;;   Expoloratory Lap  [phone]   MALONEY DILATION  05/04/2012   Procedure: Elease Hashimoto DILATION;  Surgeon: Malissa Hippo, MD;  [facility];  Service: Endoscopy;  Laterality: N/A;   ROBOTIC ASSISTED LAPAROSCOPIC HYSTERECTOMY AND SALPINGECTOMY Bilateral 08/17/2020   Procedure: XI ROBOTIC ASSISTED LAPAROSCOPIC HYSTERECTOMY AND SALPINGOOOPHORECTOMY;  Surgeon: Carrington Clamp, MD;  Location[facility] [place];  Service: Gynecology;  Laterality: Bilateral;   SAVORY DILATION  05/04/2012   Procedure: SAVORY DILATION;  Surgeon: Malissa Hippo, MD;  [facility];  Service: Endoscopy;  Laterality: N/A;   Social History   Tobacco Use   Smoking status: Never   Smokeless tobacco: Never  Vaping Use   Vaping Use: Never used  Substance Use Topics  Alcohol use: No    Alcohol/week: 0.0 standard drinks of alcohol   Drug use: No   Family History  Problem Relation Age of Onset   Cancer Mother        breast   Breast cancer Mother 59   Cancer Father        prostate   Diabetes Sister    Hypertension Sister    Heart disease Maternal Grandfather    Other Paternal Grandfather        cerebral hemorrhage   Colon cancer Neg Hx    Allergies  Allergen Reactions   Penicillins Rash    Severe rash   Review of Systems  Constitutional:  Negative for chills and fever.  HENT:  Negative for sore throat.   Respiratory:  Negative for cough and shortness of breath.   Cardiovascular:  Negative for chest  pain, palpitations and leg swelling.  Gastrointestinal:  Negative for abdominal pain, blood in stool, constipation, diarrhea, nausea and vomiting.  Genitourinary:  Negative for dysuria and hematuria.  Musculoskeletal:  Negative for myalgias.  Skin:  Negative for itching and rash.  Neurological:  Negative for dizziness and headaches.  Psychiatric/Behavioral:  Negative for depression and suicidal ideas.      Objective:     BP 112/69   Pulse 90   Ht 5\' 6"  (1.676 m)   Wt 257 lb 6.4 oz (116.8 kg)   SpO2 97%   BMI 41.55 kg/m  BP Readings from Last 3 Encounters:  08/26/22 112/69  07/09/22 122/76  07/01/22 137/76   Physical Exam Vitals reviewed.  Constitutional:      General: She is not in acute distress.    Appearance: Normal appearance. She is obese. She is not toxic-appearing.  HENT:     Head: Normocephalic and atraumatic.     Right Ear: External ear normal.     Left Ear: External ear normal.     Nose: Nose normal. No congestion or rhinorrhea.     Mouth/Throat:     Mouth: Mucous membranes are moist.     Pharynx: Oropharynx is clear. No oropharyngeal exudate or posterior oropharyngeal erythema.  Eyes:     General: No scleral icterus.    Extraocular Movements: Extraocular movements intact.     Conjunctiva/sclera: Conjunctivae normal.     Pupils: Pupils are equal, round, and reactive to light.  Cardiovascular:     Rate and Rhythm: Normal rate and regular rhythm.     Pulses: Normal pulses.     Heart sounds: Normal heart sounds. No murmur heard.    No friction rub. No gallop.  Pulmonary:     Effort: Pulmonary effort is normal.     Breath sounds: Normal breath sounds. No wheezing, rhonchi or rales.  Abdominal:     General: Abdomen is flat. Bowel sounds are normal. There is no distension.     Palpations: Abdomen is soft.     Tenderness: There is no abdominal tenderness.  Musculoskeletal:        General: No swelling. Normal range of motion.     Cervical back: Normal range  of motion.     Right lower leg: No edema.     Left lower leg: No edema.  Lymphadenopathy:     Cervical: No cervical adenopathy.  Skin:    General: Skin is warm and dry.     Capillary Refill: Capillary refill takes less than 2 seconds.     Coloration: Skin is not jaundiced.  Neurological:  General: No focal deficit present.     Mental Status: She is alert and oriented to person, place, and time.  Psychiatric:        Mood and Affect: Mood normal.        Behavior: Behavior normal.   Last CBC Lab Results  Component Value Date   WBC 6.6 04/04/2022   HGB 12.7 04/04/2022   HCT 37.1 04/04/2022   MCV 87 04/04/2022   MCH 29.9 04/04/2022   RDW 12.7 04/04/2022   PLT 239 04/04/2022   Last metabolic panel Lab Results  Component Value Date   GLUCOSE 108 (H) 04/04/2022   NA 140 04/04/2022   K 4.3 04/04/2022   CL 101 04/04/2022   CO2 23 04/04/2022   BUN 16 04/04/2022   CREATININE 1.05 (H) 04/04/2022   EGFR 60 04/04/2022   CALCIUM 10.0 04/04/2022   PROT 7.0 04/04/2022   ALBUMIN 4.3 04/04/2022   LABGLOB 2.7 04/04/2022   AGRATIO 1.6 04/04/2022   BILITOT 0.3 04/04/2022   ALKPHOS 127 (H) 04/04/2022   AST 12 04/04/2022   ALT 15 04/04/2022   ANIONGAP 9 08/14/2020   Last lipids Lab Results  Component Value Date   CHOL 167 04/04/2022   HDL 37 (L) 04/04/2022   LDLCALC 107 (H) 04/04/2022   TRIG 130 04/04/2022   CHOLHDL 4.5 (H) 04/04/2022   Last hemoglobin A1c Lab Results  Component Value Date   HGBA1C 6.1 (H) 04/04/2022   Last thyroid functions Lab Results  Component Value Date   TSH 1.530 04/04/2022   Last vitamin D Lab Results  Component Value Date   VD25OH 35.0 04/04/2022   Last vitamin B12 and Folate Lab Results  Component Value Date   VITAMINB12 543 04/04/2022   FOLATE 5.8 04/04/2022     The 10-year ASCVD risk score (Arnett DK, et al., 2019) is: 5.6%    Assessment & Plan:   Problem List Items Addressed This Visit       Morbid obesity with BMI of  40.0-44.9, adult - Primary    Presenting today for weight loss follow-up.  She has lost 15 pounds since starting Zepbound in February.  Currently prescribed 5 mg weekly. -Increase Zepbound to 7.5 mg weekly -She is concerned that her insurance will stop covering Zepbound in May.  We discussed continuing to implement weight loss strategies that will help her sustain the additional weight loss she experiences between now and next month.  With this in mind, she has been referred to medical nutrition therapy today. -We will tentatively plan for follow-up in 3 months      Return in about 3 months (around 11/25/2022).   Billie Lade, MD

## 2022-08-26 NOTE — Patient Instructions (Signed)
It was a pleasure to see you today.  Thank you for giving [place] the opportunity to be involved in your care.  Below is a brief recap of your visit and next steps.  We will plan to see you again in 3 months.  Summary Congratulations on your progress Increase Zepbound to 7.5 mg weekly I have placed a referral to nutrition Follow up in 3 months

## 2022-08-29 ENCOUNTER — Other Ambulatory Visit: Payer: Self-pay

## 2022-08-29 ENCOUNTER — Other Ambulatory Visit (HOSPITAL_COMMUNITY): Payer: Self-pay

## 2022-09-01 ENCOUNTER — Encounter: Payer: Self-pay | Admitting: Internal Medicine

## 2022-09-01 NOTE — Assessment & Plan Note (Signed)
Presenting today for weight loss follow-up.  She has lost 15 pounds since starting Zepbound in February.  Currently prescribed 5 mg weekly. -Increase Zepbound to 7.5 mg weekly -She is concerned that her insurance will stop covering Zepbound in May.  We discussed continuing to implement weight loss strategies that will help her sustain the additional weight loss she experiences between now and next month.  With this in mind, she has been referred to medical nutrition therapy today. -We will tentatively plan for follow-up in 3 months

## 2022-09-06 ENCOUNTER — Encounter: Payer: Commercial Managed Care - PPO | Attending: Internal Medicine | Admitting: Nutrition

## 2022-09-06 ENCOUNTER — Other Ambulatory Visit: Payer: Self-pay

## 2022-09-06 DIAGNOSIS — I10 Essential (primary) hypertension: Secondary | ICD-10-CM

## 2022-09-06 DIAGNOSIS — Z6841 Body Mass Index (BMI) 40.0 and over, adult: Secondary | ICD-10-CM | POA: Insufficient documentation

## 2022-09-06 NOTE — Progress Notes (Signed)
Medical Nutrition Therapy [facility] Employee Visit 1 Appointment Start time:  1200  Appointment End time:  1300  Primary concerns today: Obesity  Referral diagnosis: E66.9 Preferred learning style: No Preference  Learning readiness: Change in progress   NUTRITION ASSESSMENT  57 y old wfemale wanting help in losing weight. PCP Dr. Durwin Nora. PMH:HTN, Osteoarthritis, Obesity, GERD, total hysterectomy.  Has been put on Zeboud to help with her weight loss efforts She has been changing her eating habits with better food choices, smaller portions and cutting out empty calories, drinking water, started walking and feels a lot better. Has lost 16 lbs in the last 2 months.   Has noted when she ate something higher if fat/greasy, it made her sick on her stomach.  She is working on lifestyle medicine and trying to go more whole food plant based.  Anthropometrics  Wt Readings from Last 3 Encounters:  09/06/22 256 lb (116.1 kg)  08/26/22 257 lb 6.4 oz (116.8 kg)  07/01/22 272 lb 6.4 oz (123.6 kg)   Ht Readings from Last 3 Encounters:  09/06/22 5' 6.5" (1.689 m)  08/26/22 5\' 6"  (1.676 m)  07/01/22 5\' 7"  (1.702 m)   Body mass index is 40.7 kg/m. @BMIFA @ Facility age limit for growth %iles is 20 years. Facility age limit for growth %iles is 20 years.    Clinical Medical Hx: see chart Medications: see chart: is On Zebound Labs:  Lab Results  Component Value Date   HGBA1C 6.1 (H) 04/04/2022      Latest Ref Rng & Units 04/04/2022    8:16 AM 09/07/2021    8:48 AM 08/14/2020   12:48 PM  CMP  Glucose 70 - 99 mg/dL 295  97  93   BUN 8 - 27 mg/dL 16  19  17    Creatinine 0.57 - 1.00 mg/dL 6.21  3.08  6.57   Sodium 134 - 144 mmol/L 140  140  137   Potassium 3.5 - 5.2 mmol/L 4.3  4.0  3.4   Chloride 96 - 106 mmol/L 101  102  103   CO2 20 - 29 mmol/L 23  23  25    Calcium 8.7 - 10.3 mg/dL 84.6  9.6  9.4   Total Protein 6.0 - 8.5 g/dL 7.0     Total Bilirubin 0.0 - 1.2 mg/dL 0.3     Alkaline Phos  44 - 121 IU/L 127     AST 0 - 40 IU/L 12     ALT 0 - 32 IU/L 15      Lipid Panel     Component Value Date/Time   CHOL 167 04/04/2022 0816   TRIG 130 04/04/2022 0816   HDL 37 (L) 04/04/2022 0816   CHOLHDL 4.5 (H) 04/04/2022 0816   LDLCALC 107 (H) 04/04/2022 0816   LABVLDL 23 04/04/2022 0816    Notable Signs/Symptoms: tired  Lifestyle & Dietary Hx 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 Eats mostly at home, goes out occasionally.  Estimated daily fluid intake: 60-90 oz Supplements:  Sleep:  Stress / self-care:  Current average weekly physical activity[place]  24-Hr Dietary Recall First Meal: Yogurt and fruit or boiled egg and fruit Snack: strawberries or nuts Second Meal: 3 bean salad, water, fruit Snack: fruit or cheese Third Meal: beans, vegetables, water, fruit Snack:  Beverages: water  Estimated Energy Needs Calories: 1500 Carbohydrate: 170g Protein: 112g Fat: 42g   NUTRITION DIAGNOSIS  NI-1.7 Predicted excessive energy intake As related to  Higher carb and calorie diet in past.  As evidenced by BMI 4O and A1C 6.1%..   NUTRITION INTERVENTION  Nutrition education (E-1) on the following topics:  Lifestyle Medicine High Fiber foods Nutrient dense foods Portion sizes.  Pre DM Lifestyle Medicine  - Whole Food, Plant Predominant Nutrition is highly recommended: Eat Plenty of vegetables, Mushrooms, fruits, Legumes, Whole Grains, Nuts, seeds in lieu of processed meats, processed snacks/pastries red meat, poultry, eggs.    -It is better to avoid simple carbohydrates including: Cakes, Sweet Desserts, Ice Cream, Soda (diet and regular), Sweet Tea, Candies, Chips, Cookies, Store Bought Juices, Alcohol in Excess of  1-2 drinks a day, Lemonade,  Artificial Sweeteners, Doughnuts, Coffee Creamers, "Sugar-free" Products, etc, etc.  This is not a complete list.....  Exercise: If you are able: 30 -60 minutes a day ,4 days a week, or 150 minutes a week.  The  longer the better.  Combine stretch, strength, and aerobic activities.  If you were told in the past that you have high risk for cardiovascular diseases, you may seek evaluation by your heart doctor prior to initiating moderate to intense exercise programs.   Handouts Provided Include  Lifestyle Medicine handouts   Learning Style & Readiness for Change Teaching method utilized: Visual & Auditory  Demonstrated degree of understanding via: Teach Back  Barriers to learning/adherence to lifestyle change: None  Goals Established by Pt Focus on whole plant based foods-fruits, vegetables and whole grains, dried beans. Cut out processed and convenient foods Drink only water Don't eat after 7 pm. Excise as tolerated Lose 1-2 lbs per week.   MONITORING & EVALUATION Dietary intake, weekly physical activity, and weight  in 1 month.  Next Steps  Patient is to work on meal planning and exercise.

## 2022-09-09 ENCOUNTER — Other Ambulatory Visit (HOSPITAL_COMMUNITY): Payer: Self-pay

## 2022-09-12 ENCOUNTER — Other Ambulatory Visit: Payer: Self-pay | Admitting: Podiatry

## 2022-09-12 ENCOUNTER — Encounter: Payer: Self-pay | Admitting: Podiatry

## 2022-09-12 DIAGNOSIS — M10071 Idiopathic gout, right ankle and foot: Secondary | ICD-10-CM

## 2022-09-12 MED ORDER — METHYLPREDNISOLONE 4 MG PO TBPK: ORAL_TABLET | ORAL | 0 refills | Status: DC

## 2022-09-16 ENCOUNTER — Other Ambulatory Visit (HOSPITAL_COMMUNITY): Payer: Self-pay

## 2022-09-16 ENCOUNTER — Ambulatory Visit: Payer: Commercial Managed Care - PPO | Admitting: Internal Medicine

## 2022-09-26 ENCOUNTER — Other Ambulatory Visit: Payer: Self-pay | Admitting: Internal Medicine

## 2022-09-26 ENCOUNTER — Other Ambulatory Visit: Payer: Self-pay

## 2022-09-26 DIAGNOSIS — I10 Essential (primary) hypertension: Secondary | ICD-10-CM

## 2022-09-26 MED ORDER — TELMISARTAN-HCTZ 80-25 MG PO TABS
1.0000 | ORAL_TABLET | Freq: Every day | ORAL | 0 refills | Status: DC
  Filled 2022-09-26: qty 90, 90d supply, fill #0

## 2022-10-01 ENCOUNTER — Other Ambulatory Visit (HOSPITAL_COMMUNITY): Payer: Self-pay

## 2022-10-02 ENCOUNTER — Other Ambulatory Visit: Payer: Self-pay | Admitting: Internal Medicine

## 2022-10-02 DIAGNOSIS — M109 Gout, unspecified: Secondary | ICD-10-CM

## 2022-10-03 ENCOUNTER — Other Ambulatory Visit (HOSPITAL_COMMUNITY): Payer: Self-pay

## 2022-10-03 ENCOUNTER — Ambulatory Visit: Payer: Commercial Managed Care - PPO | Admitting: Nutrition

## 2022-10-03 MED ORDER — ALLOPURINOL 300 MG PO TABS
300.0000 mg | ORAL_TABLET | Freq: Every day | ORAL | 0 refills | Status: DC
Start: 2022-10-03 — End: 2022-12-25
  Filled 2022-10-03: qty 90, 90d supply, fill #0

## 2022-10-12 ENCOUNTER — Encounter: Payer: Self-pay | Admitting: Nutrition

## 2022-10-12 ENCOUNTER — Encounter: Payer: Commercial Managed Care - PPO | Attending: Internal Medicine | Admitting: Nutrition

## 2022-10-12 DIAGNOSIS — Z6841 Body Mass Index (BMI) 40.0 and over, adult: Secondary | ICD-10-CM | POA: Insufficient documentation

## 2022-10-12 DIAGNOSIS — R7303 Prediabetes: Secondary | ICD-10-CM

## 2022-10-12 NOTE — Patient Instructions (Addendum)
Goals  Increase exercise to  3-4 times per week, 30 minutes to an hour Focus on behavior changes and mindfulness. Watch for food triggers Take body measurements weekly to track physical changes to support healthier lifestyle. Focus on long term and steady weight loss Continue to focus on more whole plant based foods

## 2022-10-12 NOTE — Progress Notes (Signed)
Medical Nutrition Therapy [facility] Employee Visit 2   [phone] Appointment Start time:  1200  Appointment End time:  1230  Primary concerns today: Obesity  Referral diagnosis: E66.9 Preferred learning style: No Preference  Learning readiness: Change in progress   NUTRITION ASSESSMENT  Obesity follow up Changes made: Has been taking her lunch to work. Planning meals. More mindful of food choices, timing of meals and more food awareness of triggers. Still taking zebound but has  been without it for 3-4 weeks. Just got her second round of medication. Lost 12 more lbs. Total lost 28 lbs . Focusing on eating more whole plant based foods. Cut out snacksing after 7 pm. Exercising-- walking more often. Lost 12 lbs in the last 6 weeks.  Feels much better. Sleeping a lot better. More energy. Doing more self care. Goes to see PCP in July for blood work.   Anthropometrics  Wt Readings from Last 3 Encounters:  09/06/22 256 lb (116.1 kg)  08/26/22 257 lb 6.4 oz (116.8 kg)  07/01/22 272 lb 6.4 oz (123.6 kg)   Ht Readings from Last 3 Encounters:  09/06/22 5' 6.5" (1.689 m)  08/26/22 5\' 6"  (1.676 m)  07/01/22 5\' 7"  (1.702 m)   There is no height or weight on file to calculate BMI. @BMIFA @ Facility age limit for growth %iles is 20 years. Facility age limit for growth %iles is 20 years.    Clinical Medical Hx: see chart Medications: see chart: is On Zebound Labs:  Lab Results  Component Value Date   HGBA1C 6.1 (H) 04/04/2022      Latest Ref Rng & Units 04/04/2022    8:16 AM 09/07/2021    8:48 AM 08/14/2020   12:48 PM  CMP  Glucose 70 - 99 mg/dL 191  97  93   BUN 8 - 27 mg/dL 16  19  17    Creatinine 0.57 - 1.00 mg/dL 4.78  2.95  6.21   Sodium 134 - 144 mmol/L 140  140  137   Potassium 3.5 - 5.2 mmol/L 4.3  4.0  3.4   Chloride 96 - 106 mmol/L 101  102  103   CO2 20 - 29 mmol/L 23  23  25    Calcium 8.7 - 10.3 mg/dL 30.8  9.6  9.4   Total Protein 6.0 - 8.5 g/dL 7.0     Total Bilirubin 0.0  - 1.2 mg/dL 0.3     Alkaline Phos 44 - 121 IU/L 127     AST 0 - 40 IU/L 12     ALT 0 - 32 IU/L 15      Lipid Panel     Component Value Date/Time   CHOL 167 04/04/2022 0816   TRIG 130 04/04/2022 0816   HDL 37 (L) 04/04/2022 0816   CHOLHDL 4.5 (H) 04/04/2022 0816   LDLCALC 107 (H) 04/04/2022 0816   LABVLDL 23 04/04/2022 0816    Notable Signs/Symptoms: tired  Lifestyle & Dietary Hx 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 Eats mostly at home, goes out occasionally.  Estimated daily fluid intake: 60-90 oz Supplements:  Sleep:  Stress / self-care:  Current average weekly physical activity: [place]  24-Hr Dietary Recall First Meal: Yogurt and fruit or boiled egg and fruit Snack: strawberries or nuts Second Meal: 3 bean salad, water, fruit Snack: fruit or cheese Third Meal: beans, vegetables, water, fruit Snack:  Beverages: water  Estimated Energy Needs Calories: 1500 Carbohydrate: 170g Protein: 112g Fat: 42g  NUTRITION DIAGNOSIS  NI-1.7 Predicted excessive energy intake As related to Higher carb and calorie diet in past.  As evidenced by BMI 4O and A1C 6.1%..   NUTRITION INTERVENTION  Nutrition education (E-1) on the following topics:  Lifestyle Medicine High Fiber foods Nutrient dense foods Portion sizes.  Pre DM Lifestyle Medicine  - Whole Food, Plant Predominant Nutrition is highly recommended: Eat Plenty of vegetables, Mushrooms, fruits, Legumes, Whole Grains, Nuts, seeds in lieu of processed meats, processed snacks/pastries red meat, poultry, eggs.    -It is better to avoid simple carbohydrates including: Cakes, Sweet Desserts, Ice Cream, Soda (diet and regular), Sweet Tea, Candies, Chips, Cookies, Store Bought Juices, Alcohol in Excess of  1-2 drinks a day, Lemonade,  Artificial Sweeteners, Doughnuts, Coffee Creamers, "Sugar-free" Products, etc, etc.  This is not a complete list.....  Exercise: If you are able: 30 -60 minutes a day ,4 days a  week, or 150 minutes a week.  The longer the better.  Combine stretch, strength, and aerobic activities.  If you were told in the past that you have high risk for cardiovascular diseases, you may seek evaluation by your heart doctor prior to initiating moderate to intense exercise programs.   Handouts Provided Include  Lifestyle Medicine handouts   Learning Style & Readiness for Change Teaching method utilized: Visual & Auditory  Demonstrated degree of understanding via: Teach Back  Barriers to learning/adherence to lifestyle change: None  Goals Established by Pt Goals  Increase exercise to  3-4 times per week, 30 minutes to an hour Focus on behavior changes and mindfulness. Watch for food triggers Take body measurements weekly to track physical changes to support healthier lifestyle. Focus on long term and steady weight loss Continue to focus on more whole plant based foods    MONITORING & EVALUATION Dietary intake, weekly physical activity, and weight  in 3 month.  Next Steps  Patient is to work on meal planning and exercise.

## 2022-10-12 NOTE — Patient Instructions (Signed)
Focus on whole plant based foods-fruits, vegetables and whole grains, dried beans. Cut out processed and convenient foods Drink only water Don't eat after 7 pm. Excise as tolerated Lose 1-2 lbs per week.

## 2022-10-24 ENCOUNTER — Other Ambulatory Visit (HOSPITAL_COMMUNITY): Payer: Self-pay

## 2022-11-14 DIAGNOSIS — Z01419 Encounter for gynecological examination (general) (routine) without abnormal findings: Secondary | ICD-10-CM | POA: Diagnosis not present

## 2022-11-16 ENCOUNTER — Other Ambulatory Visit: Payer: Self-pay

## 2022-11-16 ENCOUNTER — Other Ambulatory Visit (HOSPITAL_COMMUNITY): Payer: Self-pay

## 2022-11-16 MED ORDER — KETOCONAZOLE 2 % EX CREA
1.0000 | TOPICAL_CREAM | Freq: Every day | CUTANEOUS | 4 refills | Status: DC
  Filled 2022-11-16: qty 120, 30d supply, fill #0

## 2022-11-25 DIAGNOSIS — Z1231 Encounter for screening mammogram for malignant neoplasm of breast: Secondary | ICD-10-CM | POA: Diagnosis not present

## 2022-12-02 ENCOUNTER — Ambulatory Visit: Payer: Commercial Managed Care - PPO | Admitting: Internal Medicine

## 2022-12-22 ENCOUNTER — Ambulatory Visit: Payer: Commercial Managed Care - PPO | Admitting: Nutrition

## 2022-12-25 ENCOUNTER — Other Ambulatory Visit: Payer: Self-pay | Admitting: Internal Medicine

## 2022-12-25 DIAGNOSIS — I10 Essential (primary) hypertension: Secondary | ICD-10-CM

## 2022-12-25 DIAGNOSIS — M109 Gout, unspecified: Secondary | ICD-10-CM

## 2022-12-26 ENCOUNTER — Other Ambulatory Visit (HOSPITAL_COMMUNITY): Payer: Self-pay

## 2022-12-26 ENCOUNTER — Other Ambulatory Visit: Payer: Self-pay

## 2022-12-26 ENCOUNTER — Encounter: Payer: Self-pay | Admitting: Gastroenterology

## 2022-12-26 MED ORDER — TELMISARTAN-HCTZ 80-25 MG PO TABS
1.0000 | ORAL_TABLET | Freq: Every day | ORAL | 0 refills | Status: DC
Start: 2022-12-26 — End: 2023-05-04
  Filled 2022-12-26: qty 90, 90d supply, fill #0

## 2022-12-26 MED ORDER — ALLOPURINOL 300 MG PO TABS
300.0000 mg | ORAL_TABLET | Freq: Every day | ORAL | 0 refills | Status: DC
Start: 2022-12-26 — End: 2023-05-04
  Filled 2022-12-26: qty 90, 90d supply, fill #0

## 2022-12-30 ENCOUNTER — Encounter: Payer: Self-pay | Admitting: Internal Medicine

## 2022-12-30 ENCOUNTER — Ambulatory Visit: Payer: Commercial Managed Care - PPO | Admitting: Internal Medicine

## 2022-12-30 VITALS — BP 118/73 | HR 71 | Ht 67.0 in | Wt 235.0 lb

## 2022-12-30 DIAGNOSIS — Z23 Encounter for immunization: Secondary | ICD-10-CM

## 2022-12-30 DIAGNOSIS — E785 Hyperlipidemia, unspecified: Secondary | ICD-10-CM

## 2022-12-30 DIAGNOSIS — E669 Obesity, unspecified: Secondary | ICD-10-CM | POA: Diagnosis not present

## 2022-12-30 DIAGNOSIS — R7303 Prediabetes: Secondary | ICD-10-CM | POA: Diagnosis not present

## 2022-12-30 DIAGNOSIS — I10 Essential (primary) hypertension: Secondary | ICD-10-CM

## 2022-12-30 NOTE — Assessment & Plan Note (Signed)
BMI 36.8.  Her weight today is 235 pounds.  She has lost 22 pounds since her last appointment.  She remains focused on lifestyle modifications aimed at weight loss.  No longer on GLP-1 therapy due to a lack of insurance coverage. -Renee Alvarez was congratulated on her progress today.  She will continue to focus on lifestyle changes aimed at weight loss.  At her request, repeat labs have been ordered today.

## 2022-12-30 NOTE — Assessment & Plan Note (Signed)
A1c 6.1 in November.  She has made significant lifestyle changes and has lost close to 40 pounds.  Repeat A1c ordered today at patient's request

## 2022-12-30 NOTE — Assessment & Plan Note (Signed)
BP remains well-controlled on telmisartan-HCTZ.  No medication changes are indicated today.

## 2022-12-30 NOTE — Progress Notes (Signed)
Established Patient Office Visit  Subjective   Patient ID: Renee Alvarez, female    DOB: [birth year]  Age: 62 y.o. MRN: [id number]  Chief Complaint  Patient presents with   Care Management    3 month f/u, would like to have labs done.    Renee Alvarez returns to care today for routine follow-up.  She was last evaluated by me on 4/12 for weight loss follow-up.  Zepbound was increased to 7.5 mg weekly at that time and she was also referred to medical nutrition therapy.  There have been no acute interval events.  Renee Alvarez reports feeling well today.  Her weight today is 235 pounds, which is down a total of 38 pounds since November 2023.  She does not have any acute concerns to discuss.  Past Medical History:  Diagnosis Date   Allergic rhinitis due to pollen    Anxiety    Arthritis    oa lumbar region   Breast cyst, right 05/23/2019   Chronic back pain    Essential (primary) hypertension    GERD (gastroesophageal reflux disease)    Gout last flare up jan 2022   Headache    Heart murmur    told years ago had mild murmur none heard recently per pt   History of hiatal hernia    Hypertension    IBS (irritable bowel syndrome)    ibsd per pt   Low back pain    Obesity, unspecified    PMB (postmenopausal bleeding)    PONV (postoperative nausea and vomiting)    slower to awkaen after cholecystectomy   Seasonal allergies    Skin irritation    both breats where bra rubs healing well using fluticasone prn per pt   Toenail fungus    left great toe using compounded med for   Wears contact lenses    Wears glasses    Zoster without complications    Past Surgical History:  Procedure Laterality Date   BALLOON DILATION  05/04/2012   Procedure: BALLOON DILATION;  Surgeon: Malissa Hippo, MD;  Location: [facility];  Service: Endoscopy;  Laterality: N/A;   CESAREAN SECTION  1997   CHOLECYSTECTOMY  yrs ago   laparoscopic   COLONOSCOPY WITH ESOPHAGOGASTRODUODENOSCOPY (EGD)  05/04/2012    Procedure: COLONOSCOPY WITH ESOPHAGOGASTRODUODENOSCOPY (EGD);  Surgeon: Malissa Hippo, MD;  Location: [facility];  Service: Endoscopy;  Laterality: N/A;  730   Cyst removed from right finger  yrs ago   CYSTOSCOPY  08/17/2020   Procedure: CYSTOSCOPY;  Surgeon: Carrington Clamp, MD;  Location: [facility];  Service: Gynecology;;   Expoloratory Lap  [phone]   MALONEY DILATION  05/04/2012   Procedure: Elease Hashimoto DILATION;  Surgeon: Malissa Hippo, MD;  Location: [facility];  Service: Endoscopy;  Laterality: N/A;   ROBOTIC ASSISTED LAPAROSCOPIC HYSTERECTOMY AND SALPINGECTOMY Bilateral 08/17/2020   Procedure: XI ROBOTIC ASSISTED LAPAROSCOPIC HYSTERECTOMY AND SALPINGOOOPHORECTOMY;  Surgeon: Carrington Clamp, MD;  Location: [facility];  Service: Gynecology;  Laterality: Bilateral;   SAVORY DILATION  05/04/2012   Procedure: SAVORY DILATION;  Surgeon: Malissa Hippo, MD;  Location: [facility];  Service: Endoscopy;  Laterality: N/A;   Social History   Tobacco Use   Smoking status: Never   Smokeless tobacco: Never  Vaping Use   Vaping status: Never Used  Substance Use Topics   Alcohol use: No    Alcohol/week: 0.0 standard drinks of alcohol   Drug use: No  Family History  Problem Relation Age of Onset   Cancer Mother        breast   Breast cancer Mother 58   Cancer Father        prostate   Diabetes Sister    Hypertension Sister    Heart disease Maternal Grandfather    Other Paternal Grandfather        cerebral hemorrhage   Colon cancer Neg Hx    Allergies  Allergen Reactions   Penicillins Rash    Severe rash   Review of Systems  Constitutional:  Negative for chills and fever.  HENT:  Negative for sore throat.   Respiratory:  Negative for cough and shortness of breath.   Cardiovascular:  Negative for chest pain, palpitations and leg swelling.  Gastrointestinal:  Negative for abdominal pain, blood in stool, constipation, diarrhea, nausea and  vomiting.  Genitourinary:  Negative for dysuria and hematuria.  Musculoskeletal:  Negative for myalgias.  Skin:  Negative for itching and rash.  Neurological:  Negative for dizziness and headaches.  Psychiatric/Behavioral:  Negative for depression and suicidal ideas.      Objective:     BP 118/73   Pulse 71   Ht 5\' 7"  (1.702 m)   Wt 235 lb (106.6 kg)   SpO2 98%   BMI 36.81 kg/m  BP Readings from Last 3 Encounters:  12/30/22 118/73  08/26/22 112/69  07/09/22 122/76   Physical Exam Vitals reviewed.  Constitutional:      General: She is not in acute distress.    Appearance: Normal appearance. She is obese. She is not toxic-appearing.  HENT:     Head: Normocephalic and atraumatic.     Right Ear: External ear normal.     Left Ear: External ear normal.     Nose: Nose normal. No congestion or rhinorrhea.     Mouth/Throat:     Mouth: Mucous membranes are moist.     Pharynx: Oropharynx is clear. No oropharyngeal exudate or posterior oropharyngeal erythema.  Eyes:     General: No scleral icterus.    Extraocular Movements: Extraocular movements intact.     Conjunctiva/sclera: Conjunctivae normal.     Pupils: Pupils are equal, round, and reactive to light.  Cardiovascular:     Rate and Rhythm: Normal rate and regular rhythm.     Pulses: Normal pulses.     Heart sounds: Normal heart sounds. No murmur heard.    No friction rub. No gallop.  Pulmonary:     Effort: Pulmonary effort is normal.     Breath sounds: Normal breath sounds. No wheezing, rhonchi or rales.  Abdominal:     General: Abdomen is flat. Bowel sounds are normal. There is no distension.     Palpations: Abdomen is soft.     Tenderness: There is no abdominal tenderness.  Musculoskeletal:        General: No swelling. Normal range of motion.     Cervical back: Normal range of motion.     Right lower leg: No edema.     Left lower leg: No edema.  Lymphadenopathy:     Cervical: No cervical adenopathy.  Skin:     General: Skin is warm and dry.     Capillary Refill: Capillary refill takes less than 2 seconds.     Coloration: Skin is not jaundiced.  Neurological:     General: No focal deficit present.     Mental Status: She is alert and oriented to person, place, and time.  Psychiatric:        Mood and Affect: Mood normal.        Behavior: Behavior normal.   Last CBC Lab Results  Component Value Date   WBC 6.6 04/04/2022   HGB 12.7 04/04/2022   HCT 37.1 04/04/2022   MCV 87 04/04/2022   MCH 29.9 04/04/2022   RDW 12.7 04/04/2022   PLT 239 04/04/2022   Last metabolic panel Lab Results  Component Value Date   GLUCOSE 108 (H) 04/04/2022   NA 140 04/04/2022   K 4.3 04/04/2022   CL 101 04/04/2022   CO2 23 04/04/2022   BUN 16 04/04/2022   CREATININE 1.05 (H) 04/04/2022   EGFR 60 04/04/2022   CALCIUM 10.0 04/04/2022   PROT 7.0 04/04/2022   ALBUMIN 4.3 04/04/2022   LABGLOB 2.7 04/04/2022   AGRATIO 1.6 04/04/2022   BILITOT 0.3 04/04/2022   ALKPHOS 127 (H) 04/04/2022   AST 12 04/04/2022   ALT 15 04/04/2022   ANIONGAP 9 08/14/2020   Last lipids Lab Results  Component Value Date   CHOL 167 04/04/2022   HDL 37 (L) 04/04/2022   LDLCALC 107 (H) 04/04/2022   TRIG 130 04/04/2022   CHOLHDL 4.5 (H) 04/04/2022   Last hemoglobin A1c Lab Results  Component Value Date   HGBA1C 6.1 (H) 04/04/2022   Last thyroid functions Lab Results  Component Value Date   TSH 1.530 04/04/2022   Last vitamin D Lab Results  Component Value Date   VD25OH 35.0 04/04/2022   Last vitamin B12 and Folate Lab Results  Component Value Date   VITAMINB12 543 04/04/2022   FOLATE 5.8 04/04/2022   The 10-year ASCVD risk score (Arnett DK, et al., 2019) is: 6.2%    Assessment & Plan:   Problem List Items Addressed This Visit       Essential (primary) hypertension - Primary    BP remains well-controlled on telmisartan-HCTZ.  No medication changes are indicated today.      Obesity (BMI 30-39.9)    BMI  36.8.  Her weight today is 235 pounds.  She has lost 22 pounds since her last appointment.  She remains focused on lifestyle modifications aimed at weight loss.  No longer on GLP-1 therapy due to a lack of insurance coverage. -Ms. Kreisel was congratulated on her progress today.  She will continue to focus on lifestyle changes aimed at weight loss.  At her request, repeat labs have been ordered today.      Prediabetes    A1c 6.1 in November.  She has made significant lifestyle changes and has lost close to 40 pounds.  Repeat A1c ordered today at patient's request      Need for Tdap vaccination    Tdap vaccine administered today      Return in about 6 months (around 07/02/2023).   Billie Lade, MD

## 2022-12-30 NOTE — Addendum Note (Signed)
Addended by: Herbie Saxon on: 12/30/2022 02:13 PM   Modules accepted: Orders

## 2022-12-30 NOTE — Patient Instructions (Signed)
It was a pleasure to see you today.  Thank you for giving [place] the opportunity to be involved in your care.  Below is a brief recap of your visit and next steps.  We will plan to see you again in 6 months.  Summary No medication changes today Congratulations on your progress Repeat labs ordered Follow up in 6 months

## 2022-12-30 NOTE — Assessment & Plan Note (Signed)
-   Tdap vaccine administered today.

## 2022-12-31 LAB — LIPID PANEL
Chol/HDL Ratio: 4.8 ratio — ABNORMAL HIGH (ref 0.0–4.4)
Cholesterol, Total: 171 mg/dL (ref 100–199)
HDL: 36 mg/dL — ABNORMAL LOW (ref >39)
LDL Chol Calc (NIH): 107 mg/dL — ABNORMAL HIGH (ref 0–99)
Triglycerides: 158 mg/dL — ABNORMAL HIGH (ref 0–149)
VLDL Cholesterol Cal: 28 mg/dL (ref 5–40)

## 2022-12-31 LAB — CBC WITH DIFFERENTIAL/PLATELET
Basophils Absolute: 0.1 10*3/uL (ref 0.0–0.2)
Basos: 1 %
EOS (ABSOLUTE): 0.2 10*3/uL (ref 0.0–0.4)
Eos: 3 %
Hematocrit: 39.1 % (ref 34.0–46.6)
Hemoglobin: 12.9 g/dL (ref 11.1–15.9)
Immature Grans (Abs): 0 10*3/uL (ref 0.0–0.1)
Immature Granulocytes: 0 %
Lymphocytes Absolute: 2.2 10*3/uL (ref 0.7–3.1)
Lymphs: 29 %
MCH: 29.8 pg (ref 26.6–33.0)
MCHC: 33 g/dL (ref 31.5–35.7)
MCV: 90 fL (ref 79–97)
Monocytes Absolute: 0.5 10*3/uL (ref 0.1–0.9)
Monocytes: 6 %
Neutrophils Absolute: 4.7 10*3/uL (ref 1.4–7.0)
Neutrophils: 61 %
Platelets: 258 10*3/uL (ref 150–450)
RBC: 4.33 x10E6/uL (ref 3.77–5.28)
RDW: 13.2 % (ref 11.7–15.4)
WBC: 7.7 10*3/uL (ref 3.4–10.8)

## 2022-12-31 LAB — CMP14+EGFR
ALT: 11 IU/L (ref 0–32)
AST: 16 IU/L (ref 0–40)
Albumin: 4.6 g/dL (ref 3.9–4.9)
Alkaline Phosphatase: 146 IU/L — ABNORMAL HIGH (ref 44–121)
BUN/Creatinine Ratio: 20 (ref 12–28)
BUN: 25 mg/dL (ref 8–27)
Bilirubin Total: 0.5 mg/dL (ref 0.0–1.2)
CO2: 20 mmol/L (ref 20–29)
Calcium: 10.2 mg/dL (ref 8.7–10.3)
Chloride: 98 mmol/L (ref 96–106)
Creatinine, Ser: 1.23 mg/dL — ABNORMAL HIGH (ref 0.57–1.00)
Globulin, Total: 2.5 g/dL (ref 1.5–4.5)
Glucose: 80 mg/dL (ref 70–99)
Potassium: 3.8 mmol/L (ref 3.5–5.2)
Sodium: 137 mmol/L (ref 134–144)
Total Protein: 7.1 g/dL (ref 6.0–8.5)
eGFR: 49 mL/min/{1.73_m2} — ABNORMAL LOW (ref >59)

## 2022-12-31 LAB — HEMOGLOBIN A1C
Est. average glucose Bld gHb Est-mCnc: 114 mg/dL
Hgb A1c MFr Bld: 5.6 % (ref 4.8–5.6)

## 2022-12-31 LAB — B12 AND FOLATE PANEL
Folate: 5.5 ng/mL (ref >3.0)
Vitamin B-12: 607 pg/mL (ref 232–1245)

## 2022-12-31 LAB — TSH+FREE T4
Free T4: 1.34 ng/dL (ref 0.82–1.77)
TSH: 0.959 u[IU]/mL (ref 0.450–4.500)

## 2022-12-31 LAB — VITAMIN D 25 HYDROXY (VIT D DEFICIENCY, FRACTURES): Vit D, 25-Hydroxy: 29.7 ng/mL — ABNORMAL LOW (ref 30.0–100.0)

## 2023-01-03 ENCOUNTER — Encounter: Payer: Self-pay | Admitting: Internal Medicine

## 2023-01-14 ENCOUNTER — Ambulatory Visit
Admission: RE | Admit: 2023-01-14 | Discharge: 2023-01-14 | Disposition: A | Discharge status: Home / Self Care | Payer: Commercial Managed Care - PPO | Source: Ambulatory Visit | Attending: Nurse Practitioner | Admitting: Nurse Practitioner

## 2023-01-14 VITALS — BP 132/83 | HR 70 | Temp 98.2°F | Resp 18

## 2023-01-14 DIAGNOSIS — M79672 Pain in left foot: Secondary | ICD-10-CM

## 2023-01-14 DIAGNOSIS — M722 Plantar fascial fibromatosis: Secondary | ICD-10-CM

## 2023-01-14 MED ORDER — PREDNISONE 20 MG PO TABS: 40.0000 mg | ORAL_TABLET | Freq: Every day | ORAL | 0 refills | Status: AC

## 2023-01-14 MED ORDER — DEXAMETHASONE SODIUM PHOSPHATE 10 MG/ML IJ SOLN
10.0000 mg | INTRAMUSCULAR | Status: AC
  Administered 2023-01-14: 10 mg via INTRAMUSCULAR

## 2023-01-14 NOTE — ED Provider Notes (Signed)
[facility]    CSN: [id number] Arrival date & time: 01/14/23  0847      History   Chief Complaint Chief Complaint  Patient presents with   Foot Pain    Left foot - pain in heel. Especially when I am off of it and get back up to walk. Beginning to have discomfort on the side of my foot. - Entered by patient    HPI Renee Alvarez is a 62 y.o. female.   The history is provided by the patient.   Patient presents for complaints of pain in the left heel that has been present for the past several days.  Patient states pain worsens when she goes from a sitting to a standing position.  Patient also states that the pain can intermittently radiate up into her ankle.  She denies injury or trauma, bruising, swelling, redness, or inability to bear weight.  Patient reports that she does have a history of a heel spur in the left foot, but it was repaired more than 30 years ago.  Patient reports she has been taking Tylenol and ibuprofen with minimal relief.  Past Medical History:  Diagnosis Date   Allergic rhinitis due to pollen    Anxiety    Arthritis    oa lumbar region   Breast cyst, right 05/23/2019   Chronic back pain    Essential (primary) hypertension    GERD (gastroesophageal reflux disease)    Gout last flare up jan 2022   Headache    Heart murmur    told years ago had mild murmur none heard recently per pt   History of hiatal hernia    Hypertension    IBS (irritable bowel syndrome)    ibsd per pt   Low back pain    Obesity, unspecified    PMB (postmenopausal bleeding)    PONV (postoperative nausea and vomiting)    slower to awkaen after cholecystectomy   Seasonal allergies    Skin irritation    both breats where bra rubs healing well using fluticasone prn per pt   Toenail fungus    left great toe using compounded med for   Wears contact lenses    Wears glasses    Zoster without complications     Patient Active Problem List   Diagnosis Date Noted    Prediabetes 12/30/2022   Need for Tdap vaccination 12/30/2022   Encounter for annual general medical examination with abnormal findings in adult 04/01/2022   Anxiety 09/07/2021   DDD (degenerative disc disease), lumbar 09/07/2021   Primary osteoarthritis involving multiple joints 09/07/2021   Obesity (BMI 30-39.9) 10/08/2019   Postmenopausal bleeding 10/08/2019   Gastroesophageal reflux disease without esophagitis 05/23/2019   Allergic rhinitis due to pollen    Essential (primary) hypertension     Past Surgical History:  Procedure Laterality Date   BALLOON DILATION  05/04/2012   Procedure: BALLOON DILATION;  Surgeon: Malissa Hippo, MD;  Location: [facility];  Service: Endoscopy;  Laterality: N/A;   CESAREAN SECTION  1997   CHOLECYSTECTOMY  yrs ago   laparoscopic   COLONOSCOPY WITH ESOPHAGOGASTRODUODENOSCOPY (EGD)  05/04/2012   Procedure: COLONOSCOPY WITH ESOPHAGOGASTRODUODENOSCOPY (EGD);  Surgeon: Malissa Hippo, MD;  Location: [facility];  Service: Endoscopy;  Laterality: N/A;  730   Cyst removed from right finger  yrs ago   CYSTOSCOPY  08/17/2020   Procedure: CYSTOSCOPY;  Surgeon: Carrington Clamp, MD;  Location: [facility];  Service: Gynecology;;  Expoloratory Lap  1980"s   MALONEY DILATION  05/04/2012   Procedure: MALONEY DILATION;  Surgeon: Malissa Hippo, MD;  Location: [facility];  Service: Endoscopy;  Laterality: N/A;   ROBOTIC ASSISTED LAPAROSCOPIC HYSTERECTOMY AND SALPINGECTOMY Bilateral 08/17/2020   Procedure: XI ROBOTIC ASSISTED LAPAROSCOPIC HYSTERECTOMY AND SALPINGOOOPHORECTOMY;  Surgeon: Carrington Clamp, MD;  Location: [facility];  Service: Gynecology;  Laterality: Bilateral;   SAVORY DILATION  05/04/2012   Procedure: SAVORY DILATION;  Surgeon: Malissa Hippo, MD;  Location: [facility];  Service: Endoscopy;  Laterality: N/A;    OB History     Gravida  2   Para  2   Term      Preterm      AB      Living  2       SAB      IAB      Ectopic      Multiple      Live Births               Home Medications    Prior to Admission medications   Medication Sig Start Date End Date Taking? Authorizing Provider  predniSONE (DELTASONE) 20 MG tablet Take 2 tablets (40 mg total) by mouth daily with breakfast for 5 days. 01/14/23 01/19/23 Yes Chivas Notz-Warren, Sadie Haber, NP  acetaminophen (TYLENOL) 500 MG tablet Take 500 mg by mouth every 6 (six) hours as needed.    [provider]  allopurinol (ZYLOPRIM) 300 MG tablet Take 1 tablet (300 mg) by mouth daily. 12/26/22   Billie Lade, MD  Cholecalciferol (VITAMIN D) 50 MCG (2000 UT) CAPS Take 1 capsule by mouth daily. Patient not taking: Reported on 12/30/2022    [provider]  hydrOXYzine (VISTARIL) 25 MG capsule Take 1 capsule (25 mg total) by mouth every 8 (eight) hours as needed. 07/01/22   Billie Lade, MD  ibuprofen (ADVIL) 200 MG tablet Take 200 mg by mouth every 6 (six) hours as needed.    [provider]  ketoconazole (NIZORAL) 2 % cream Apply 1 Application topically to affected area daily. 11/14/22     levocetirizine (XYZAL) 5 MG tablet Take 10 mg by mouth daily. Takes 2 of 10 mg in am per pt    [provider]  telmisartan-hydrochlorothiazide (MICARDIS HCT) 80-25 MG tablet Take 1 tablet by mouth daily. 12/26/22   Billie Lade, MD  progesterone (PROMETRIUM) 200 MG capsule Take 200 mg by mouth at bedtime. 05/21/20 09/01/20  [provider]    Family History Family History  Problem Relation Age of Onset   Cancer Mother        breast   Breast cancer Mother 62   Cancer Father        prostate   Diabetes Sister    Hypertension Sister    Heart disease Maternal Grandfather    Other Paternal Grandfather        cerebral hemorrhage   Colon cancer Neg Hx     Social History Social History   Tobacco Use   Smoking status: Never   Smokeless tobacco: Never  Vaping Use   Vaping status: Never Used   Substance Use Topics   Alcohol use: No    Alcohol/week: 0.0 standard drinks of alcohol   Drug use: No     Allergies   Penicillins   Review of Systems Review of Systems Per HPI  Physical Exam Triage Vital Signs ED Triage Vitals  Encounter Vitals  Group     BP 01/14/23 0931 132/83     Systolic BP Percentile --      Diastolic BP Percentile --      Pulse Rate 01/14/23 0931 70     Resp 01/14/23 0931 18     Temp 01/14/23 0931 98.2 F (36.8 C)     Temp Source 01/14/23 0931 Oral     SpO2 01/14/23 0931 95 %     Weight --      Height --      Head Circumference --      Peak Flow --      Pain Score 01/14/23 0934 6     Pain Loc --      Pain Education --      Exclude from Growth Chart --    No data found.  Updated Vital Signs BP 132/83 (BP Location: Right Arm)   Pulse 70   Temp 98.2 F (36.8 C) (Oral)   Resp 18   SpO2 95%   Visual Acuity Right Eye Distance:   Left Eye Distance:   Bilateral Distance:    Right Eye Near:   Left Eye Near:    Bilateral Near:     Physical Exam Vitals and nursing note reviewed.  Constitutional:      General: She is not in acute distress.    Appearance: Normal appearance.  HENT:     Head: Normocephalic.  Eyes:     Extraocular Movements: Extraocular movements intact.     Pupils: Pupils are equal, round, and reactive to light.  Pulmonary:     Effort: Pulmonary effort is normal.  Musculoskeletal:     Cervical back: Normal range of motion.     Left foot: Normal range of motion and normal capillary refill. Tenderness (Calcaneus) present. No swelling or deformity. Normal pulse.     Comments: Increased pain when attempting to walk on tiptoes.  Skin:    General: Skin is warm and dry.  Neurological:     General: No focal deficit present.     Mental Status: She is alert and oriented to person, place, and time.  Psychiatric:        Mood and Affect: Mood normal.        Behavior: Behavior normal.      UC Treatments / Results   Labs (all labs ordered are listed, but only abnormal results are displayed) Labs Reviewed - No data to display  EKG   Radiology No results found.  Procedures Procedures (including critical care time)  Medications Ordered in UC Medications  dexamethasone (DECADRON) injection 10 mg (has no administration in time range)    Initial Impression / Assessment and Plan / UC Course  I have reviewed the triage vital signs and the nursing notes.  Pertinent labs & imaging results that were available during my care of the patient were reviewed by me and considered in my medical decision making (see chart for details).  The patient is well-appearing, she is in no acute distress, vital signs are stable.  Symptoms appear to be consistent with plantar fasciitis.  Shared decision making with patient regarding deferring x-ray, unless symptoms continue to persist or worsen.  Decadron 10 mg IM administered for inflammation.  Will start patient on prednisone 40 mg for the next 5 days.  Supportive care recommendations were provided and discussed with the patient to include Tylenol arthritis strength 650 mg tablets, rolling the left foot on a frozen water bottle, and wearing shoes with good  insole and support.  Patient was advised if symptoms do not improve with this treatment, she may follow-up in this clinic for imaging of the left foot, or follow-up with orthopedics for further evaluation.  Patient was in agreement with this plan of care and verbalizes understanding.  All questions were answered.  Patient stable for discharge.  Final Clinical Impressions(s) / UC Diagnoses   Final diagnoses:  Plantar fasciitis of left foot  Pain of left heel     Discharge Instructions      You were given an injection of Decadron 10 mg today.  Start the prednisone on 01/15/2023. Take Tylenol arthritis strength 650 mg tablet every 8 hours as needed for pain or discomfort. Make sure you are wearing shoes with good  insole and support. Freeze a water bottle and rolled the left foot on the water bottle as needed to help with pain and discomfort. Perform the exercises provided today at least 2-3 times daily. As discussed, if symptoms do not improve with this treatment, you may follow-up in this clinic for an x-ray of the foot, or follow-up with your podiatrist/orthopedist previously seen for further evaluation. Follow-up as needed.     ED Prescriptions     Medication Sig Dispense Auth. Provider   predniSONE (DELTASONE) 20 MG tablet Take 2 tablets (40 mg total) by mouth daily with breakfast for 5 days. 10 tablet Danamarie Minami-Warren, Sadie Haber, NP      PDMP not reviewed this encounter.   Abran Cantor, NP 01/14/23 1017

## 2023-01-14 NOTE — ED Triage Notes (Signed)
Pt reports she has left heel pain when she goes from a sitting to a standing position x 1 week. States the pain radiates up to her ankle   Took tylenol and ibuprofen but only slight relief.

## 2023-01-14 NOTE — Discharge Instructions (Signed)
You were given an injection of Decadron 10 mg today.  Start the prednisone on 01/15/2023. Take Tylenol arthritis strength 650 mg tablet every 8 hours as needed for pain or discomfort. Make sure you are wearing shoes with good insole and support. Freeze a water bottle and rolled the left foot on the water bottle as needed to help with pain and discomfort. Perform the exercises provided today at least 2-3 times daily. As discussed, if symptoms do not improve with this treatment, you may follow-up in this clinic for an x-ray of the foot, or follow-up with your podiatrist/orthopedist previously seen for further evaluation. Follow-up as needed.

## 2023-01-26 ENCOUNTER — Telehealth: Payer: Self-pay | Admitting: Student

## 2023-01-26 NOTE — Telephone Encounter (Signed)
Pt called and has went to urgent care for left heel pain and it has gotten worse, was given medication and knows it is not gout. She was asking to come in this afternoon last appt of day or tomorrow after 12. You have a fiull schedule and is not sure what to do. If needed you can send message in mychart or lvm

## 2023-01-26 NOTE — Telephone Encounter (Signed)
Left message for pt that Dr Ardelle Anton said she could come in at 1230 tomorrow and for her to call to confirm

## 2023-01-27 ENCOUNTER — Ambulatory Visit: Payer: Commercial Managed Care - PPO | Admitting: Podiatry

## 2023-01-27 ENCOUNTER — Ambulatory Visit (INDEPENDENT_AMBULATORY_CARE_PROVIDER_SITE_OTHER): Payer: Commercial Managed Care - PPO

## 2023-01-27 DIAGNOSIS — M722 Plantar fascial fibromatosis: Secondary | ICD-10-CM

## 2023-01-27 DIAGNOSIS — M7732 Calcaneal spur, left foot: Secondary | ICD-10-CM | POA: Diagnosis not present

## 2023-01-27 MED ORDER — MELOXICAM 15 MG PO TABS: 15.0000 mg | ORAL_TABLET | Freq: Every day | ORAL | 0 refills | Status: DC

## 2023-01-27 NOTE — Patient Instructions (Signed)
For instructions on how to put on your Night Splint, please visit [email or web address]   Plantar Fasciitis (Heel Spur Syndrome) with Rehab The plantar fascia is a fibrous, ligament-like, soft-tissue structure that spans the bottom of the foot. Plantar fasciitis is a condition that causes pain in the foot due to inflammation of the tissue. SYMPTOMS   Pain and tenderness on the underneath side of the foot.  Pain that worsens with standing or walking. CAUSES  Plantar fasciitis is caused by irritation and injury to the plantar fascia on the underneath side of the foot. Common mechanisms of injury include:  Direct trauma to bottom of the foot.  Damage to a small nerve that runs under the foot where the main fascia attaches to the heel bone.  Stress placed on the plantar fascia due to bone spurs. RISK INCREASES WITH:   Activities that place stress on the plantar fascia (running, jumping, pivoting, or cutting).  Poor strength and flexibility.  Improperly fitted shoes.  Tight calf muscles.  Flat feet.  Failure to warm-up properly before activity.  Obesity. PREVENTION  Warm up and stretch properly before activity.  Allow for adequate recovery between workouts.  Maintain physical fitness:  Strength, flexibility, and endurance.  Cardiovascular fitness.  Maintain a health body weight.  Avoid stress on the plantar fascia.  Wear properly fitted shoes, including arch supports for individuals who have flat feet.  PROGNOSIS  If treated properly, then the symptoms of plantar fasciitis usually resolve without surgery. However, occasionally surgery is necessary.  RELATED COMPLICATIONS   Recurrent symptoms that may result in a chronic condition.  Problems of the lower back that are caused by compensating for the injury, such as limping.  Pain or weakness of the foot during push-off following surgery.  Chronic inflammation, scarring, and partial or complete fascia tear,  occurring more often from repeated injections.  TREATMENT  Treatment initially involves the use of ice and medication to help reduce pain and inflammation. The use of strengthening and stretching exercises may help reduce pain with activity, especially stretches of the Achilles tendon. These exercises may be performed at home or with a therapist. Your caregiver may recommend that you use heel cups of arch supports to help reduce stress on the plantar fascia. Occasionally, corticosteroid injections are given to reduce inflammation. If symptoms persist for greater than 6 months despite non-surgical (conservative), then surgery may be recommended.   MEDICATION   If pain medication is necessary, then nonsteroidal anti-inflammatory medications, such as aspirin and ibuprofen, or other minor pain relievers, such as acetaminophen, are often recommended.  Do not take pain medication within 7 days before surgery.  Prescription pain relievers may be given if deemed necessary by your caregiver. Use only as directed and only as much as you need.  Corticosteroid injections may be given by your caregiver. These injections should be reserved for the most serious cases, because they may only be given a certain number of times.  HEAT AND COLD  Cold treatment (icing) relieves pain and reduces inflammation. Cold treatment should be applied for 10 to 15 minutes every 2 to 3 hours for inflammation and pain and immediately after any activity that aggravates your symptoms. Use ice packs or massage the area with a piece of ice (ice massage).  Heat treatment may be used prior to performing the stretching and strengthening activities prescribed by your caregiver, physical therapist, or athletic trainer. Use a heat pack or soak the injury in warm water.  SEEK IMMEDIATE MEDICAL CARE  IF:  Treatment seems to offer no benefit, or the condition worsens.  Any medications produce adverse side effects.  EXERCISES- RANGE OF  MOTION (ROM) AND STRETCHING EXERCISES - Plantar Fasciitis (Heel Spur Syndrome) These exercises may help you when beginning to rehabilitate your injury. Your symptoms may resolve with or without further involvement from your physician, physical therapist or athletic trainer. While completing these exercises, remember:   Restoring tissue flexibility helps normal motion to return to the joints. This allows healthier, less painful movement and activity.  An effective stretch should be held for at least 30 seconds.  A stretch should never be painful. You should only feel a gentle lengthening or release in the stretched tissue.  RANGE OF MOTION - Toe Extension, Flexion  Sit with your right / left leg crossed over your opposite knee.  Grasp your toes and gently pull them back toward the top of your foot. You should feel a stretch on the bottom of your toes and/or foot.  Hold this stretch for 10 seconds.  Now, gently pull your toes toward the bottom of your foot. You should feel a stretch on the top of your toes and or foot.  Hold this stretch for 10 seconds. Repeat  times. Complete this stretch 3 times per day.   RANGE OF MOTION - Ankle Dorsiflexion, Active Assisted  Remove shoes and sit on a chair that is preferably not on a carpeted surface.  Place right / left foot under knee. Extend your opposite leg for support.  Keeping your heel down, slide your right / left foot back toward the chair until you feel a stretch at your ankle or calf. If you do not feel a stretch, slide your bottom forward to the edge of the chair, while still keeping your heel down.  Hold this stretch for 10 seconds. Repeat 3 times. Complete this stretch 2 times per day.   STRETCH  Gastroc, Standing  Place hands on wall.  Extend right / left leg, keeping the front knee somewhat bent.  Slightly point your toes inward on your back foot.  Keeping your right / left heel on the floor and your knee straight, shift  your weight toward the wall, not allowing your back to arch.  You should feel a gentle stretch in the right / left calf. Hold this position for 10 seconds. Repeat 3 times. Complete this stretch 2 times per day.  STRETCH  Soleus, Standing  Place hands on wall.  Extend right / left leg, keeping the other knee somewhat bent.  Slightly point your toes inward on your back foot.  Keep your right / left heel on the floor, bend your back knee, and slightly shift your weight over the back leg so that you feel a gentle stretch deep in your back calf.  Hold this position for 10 seconds. Repeat 3 times. Complete this stretch 2 times per day.  STRETCH  Gastrocsoleus, Standing  Note: This exercise can place a lot of stress on your foot and ankle. Please complete this exercise only if specifically instructed by your caregiver.   Place the ball of your right / left foot on a step, keeping your other foot firmly on the same step.  Hold on to the wall or a rail for balance.  Slowly lift your other foot, allowing your body weight to press your heel down over the edge of the step.  You should feel a stretch in your right / left calf.  Hold this position  for 10 seconds.  Repeat this exercise with a slight bend in your right / left knee. Repeat 3 times. Complete this stretch 2 times per day.   STRENGTHENING EXERCISES - Plantar Fasciitis (Heel Spur Syndrome)  These exercises may help you when beginning to rehabilitate your injury. They may resolve your symptoms with or without further involvement from your physician, physical therapist or athletic trainer. While completing these exercises, remember:   Muscles can gain both the endurance and the strength needed for everyday activities through controlled exercises.  Complete these exercises as instructed by your physician, physical therapist or athletic trainer. Progress the resistance and repetitions only as guided.  STRENGTH - Towel Curls  Sit in  a chair positioned on a non-carpeted surface.  Place your foot on a towel, keeping your heel on the floor.  Pull the towel toward your heel by only curling your toes. Keep your heel on the floor. Repeat 3 times. Complete this exercise 2 times per day.  STRENGTH - Ankle Inversion  Secure one end of a rubber exercise band/tubing to a fixed object (table, pole). Loop the other end around your foot just before your toes.  Place your fists between your knees. This will focus your strengthening at your ankle.  Slowly, pull your big toe up and in, making sure the band/tubing is positioned to resist the entire motion.  Hold this position for 10 seconds.  Have your muscles resist the band/tubing as it slowly pulls your foot back to the starting position. Repeat 3 times. Complete this exercises 2 times per day.  Document Released: 05/02/2005 Document Revised: 07/25/2011 Document Reviewed: 08/14/2008 [facility] Patient Information 2014 [place], [place].

## 2023-01-27 NOTE — Progress Notes (Signed)
Subjective: Chief Complaint  Patient presents with   Foot Pain    Left heel pain getting worse. Patient went to urgent care and was given a steroid injection and 5 days of prednisone.    62 year old female presents the office today with concerns of left heel pain Over the last couple weeks.,  Prior to this but last couple weeks has not started to worsen.  She went to urgent care was told she did not have gout he was diagnosed with plantar fasciitis.  She was given a steroid injection as well as oral steroids which helped take the edge off but she still has pain.  She also is having pain that she went to the [employer] [facility] and got inserts which helped a little bit.  No injuries that she reports no radiating pain.  No swelling.  She states that she gets pain in the morning after sitting as she gets walking it feels better but still sore.  She also gets discomfort she stands in 1 place for too long.  No other concerns.  Objective: AAO x3, NAD DP/PT pulses palpable bilaterally, CRT less than 3 seconds Tenderness to palpation along the plantar medial tubercle of the calcaneus at the insertion of plantar fascia on the left foot.  The discomfort does extend along the plantar aspect of the arch of the foot about halfway down.  Plantar fascia appears to be intact. There is no pain with lateral compression of the calcaneus or pain with vibratory sensation. There is no pain along the course or insertion of the achilles tendon. No other areas of tenderness to bilateral lower extremities.  MMT 5/5.  Equinus present. No pain with calf compression, swelling, warmth, erythema  Assessment: Plantar fasciitis left foot, heel spur  Plan: -All treatment options discussed with the patient including all alternatives, risks, complications.  -X-rays obtained reviewed.  3 views of the foot were obtained.  There is no evidence of acute fracture.  Calcaneal spurring is present. -We discussed steroid injection but  ultimately settled upon this today. -Prescribed mobic. Discussed side effects of the medication and directed to stop if any are to occur and call the office.  -Plantar fascial brace dispensed to help support the plantar fascia during the day -Night splint dispensed. Static or dynamic ankle foot orthosis, including soft interface material, adjustable for fit, for positioning, may be used for minimal ambulation, prefabricated, off-the-shelf was dispensed on the billed date of service  -Discussed stretching, icing and regular basis as well as shoes, good arch support. -Patient encouraged to call the office with any questions, concerns, change in symptoms.   Return in about 3 weeks (around 02/17/2023) for plantar fasciitis, heel pain.  Vivi Barrack DPM

## 2023-02-17 ENCOUNTER — Ambulatory Visit: Payer: Commercial Managed Care - PPO | Admitting: Podiatry

## 2023-02-17 ENCOUNTER — Encounter: Payer: Self-pay | Admitting: Internal Medicine

## 2023-02-17 ENCOUNTER — Other Ambulatory Visit: Payer: Self-pay | Admitting: Internal Medicine

## 2023-02-17 DIAGNOSIS — M722 Plantar fascial fibromatosis: Secondary | ICD-10-CM

## 2023-02-17 DIAGNOSIS — Z1212 Encounter for screening for malignant neoplasm of rectum: Secondary | ICD-10-CM

## 2023-02-17 DIAGNOSIS — Z1211 Encounter for screening for malignant neoplasm of colon: Secondary | ICD-10-CM

## 2023-02-17 NOTE — Progress Notes (Unsigned)
inj

## 2023-02-17 NOTE — Patient Instructions (Signed)

## 2023-02-28 ENCOUNTER — Encounter: Payer: Self-pay | Admitting: Podiatry

## 2023-03-02 ENCOUNTER — Telehealth: Payer: Self-pay | Admitting: Podiatry

## 2023-03-02 NOTE — Telephone Encounter (Signed)
Pt called to get scheduled for her left foot pain and your schedule is full. She prefers Friday after 1200 as that is when she gets off.  I did offer her 10/28 and she cannot do that week because of work. Please advise

## 2023-03-02 NOTE — Telephone Encounter (Signed)
Ok to put pt at 115pm tomorrow 10/18. Lvm for pt to call to confirm she is able to make appt for tomorrow at 115pm.

## 2023-03-03 ENCOUNTER — Ambulatory Visit: Payer: Commercial Managed Care - PPO | Admitting: Podiatry

## 2023-03-03 ENCOUNTER — Encounter: Payer: Self-pay | Admitting: Podiatry

## 2023-03-03 VITALS — Ht 67.0 in | Wt 235.0 lb

## 2023-03-03 DIAGNOSIS — M722 Plantar fascial fibromatosis: Secondary | ICD-10-CM

## 2023-03-03 NOTE — Patient Instructions (Signed)

## 2023-03-07 ENCOUNTER — Encounter: Payer: Self-pay | Admitting: Internal Medicine

## 2023-03-07 MED ORDER — AZITHROMYCIN 250 MG PO TABS: ORAL_TABLET | ORAL | 0 refills | Status: DC

## 2023-03-08 NOTE — Progress Notes (Signed)
Subjective: Chief Complaint  Patient presents with   Foot Pain    Left foot pain   62 year old female presents the above concerns.  She said the injection that she had previously helped a lot and where she had the injection the pain is gone but just do not want to bottom, lateral aspect.  She still gets pain about the right heel.  No new issues.  She still stretching, icing.  She is wearing orthotics from the [employer].  Objective: AAO x3, NAD DP/PT pulses palpable bilaterally, CRT less than 3 seconds There is continued tenderness palpation on the plantar aspect of the left foot along the plantar aspect the heel on the plantar central, plantar lateral aspect.  There is no pain with lateral compression of the calcaneus.  There is no edema no erythema.  No areas of point tenderness.  Negative Tinel sign.  Flexor, extensor tendons appear to be intact. No pain with calf compression, swelling, warmth, erythema  Assessment: Plantar fasciitis  Plan: -All treatment options discussed with the patient including all alternatives, risks, complications.  -We discussed a second steroid injection.  He wishes to proceed.  See procedure note below.  Discussed risks of repeat injection.  Continue stretching, icing regular basis as well as shoes, good support.  Although she did take the [employer] orthotics out and see how she does without this.  I dispensed a gel heel cup.  If needed we discussed adding in orthotics back with a gel heel cup as I think the orthotic to be too hard causing some heel pain as well.  We discussed custom orthotics. -Patient encouraged to call the office with any questions, concerns, change in symptoms.   Procedure: Injection Tendon/Ligament Discussed alternatives, risks, complications and verbal consent was obtained.  Location: Left plantar fascia at the glabrous junction; medial approach. Skin Prep: Alcohol. Injectate: 0.5cc 0.5% marcaine plain, 0.5 cc 2% lidocaine plain and,  1 cc kenalog 10. Disposition: Patient tolerated procedure well. Injection site dressed with a band-aid.  Post-injection care was discussed and return precautions discussed.   No follow-ups on file.  Vivi Barrack DPM

## 2023-03-24 ENCOUNTER — Encounter: Payer: Commercial Managed Care - PPO | Admitting: Gastroenterology

## 2023-04-03 ENCOUNTER — Telehealth: Payer: Self-pay

## 2023-04-03 ENCOUNTER — Ambulatory Visit (AMBULATORY_SURGERY_CENTER): Payer: Commercial Managed Care - PPO

## 2023-04-03 VITALS — Ht 68.0 in | Wt 231.0 lb

## 2023-04-03 DIAGNOSIS — Z1211 Encounter for screening for malignant neoplasm of colon: Secondary | ICD-10-CM

## 2023-04-03 MED ORDER — SUTAB 1479-225-188 MG PO TABS
12.0000 | ORAL_TABLET | ORAL | 0 refills | Status: AC
Start: 2023-04-03 — End: ?

## 2023-04-03 NOTE — Progress Notes (Signed)

## 2023-04-03 NOTE — Telephone Encounter (Signed)
Call back no answer

## 2023-04-03 NOTE — Telephone Encounter (Signed)
Called alternate number but did not leave message.  Someone named Toniann Fail left the message on voice mail and I did not want to leave message.  Will cal back in 5 min.

## 2023-04-03 NOTE — Telephone Encounter (Signed)
No answer will call back in 5 min.  Left message that we had appt. At 1230

## 2023-04-04 ENCOUNTER — Encounter: Payer: Self-pay | Admitting: Gastroenterology

## 2023-04-09 ENCOUNTER — Encounter: Payer: Self-pay | Admitting: Podiatry

## 2023-04-10 ENCOUNTER — Other Ambulatory Visit: Payer: Self-pay | Admitting: Podiatry

## 2023-04-10 MED ORDER — METHYLPREDNISOLONE 4 MG PO TBPK: ORAL_TABLET | ORAL | 0 refills | Status: DC

## 2023-04-24 ENCOUNTER — Encounter: Payer: Commercial Managed Care - PPO | Admitting: Gastroenterology

## 2023-05-04 ENCOUNTER — Other Ambulatory Visit: Payer: Self-pay | Admitting: Internal Medicine

## 2023-05-04 DIAGNOSIS — M109 Gout, unspecified: Secondary | ICD-10-CM

## 2023-05-04 DIAGNOSIS — I10 Essential (primary) hypertension: Secondary | ICD-10-CM

## 2023-05-05 ENCOUNTER — Other Ambulatory Visit (HOSPITAL_COMMUNITY): Payer: Self-pay

## 2023-05-05 ENCOUNTER — Other Ambulatory Visit: Payer: Self-pay

## 2023-05-05 MED ORDER — TELMISARTAN-HCTZ 80-25 MG PO TABS
1.0000 | ORAL_TABLET | Freq: Every day | ORAL | 0 refills | Status: DC
  Filled 2023-05-05: qty 90, 90d supply, fill #0

## 2023-05-05 MED ORDER — ALLOPURINOL 300 MG PO TABS
300.0000 mg | ORAL_TABLET | Freq: Every day | ORAL | 0 refills | Status: DC
  Filled 2023-05-05: qty 90, 90d supply, fill #0

## 2023-05-18 DIAGNOSIS — L821 Other seborrheic keratosis: Secondary | ICD-10-CM | POA: Diagnosis not present

## 2023-05-18 DIAGNOSIS — D2361 Other benign neoplasm of skin of right upper limb, including shoulder: Secondary | ICD-10-CM | POA: Diagnosis not present

## 2023-05-18 DIAGNOSIS — D2271 Melanocytic nevi of right lower limb, including hip: Secondary | ICD-10-CM | POA: Diagnosis not present

## 2023-05-18 DIAGNOSIS — L309 Dermatitis, unspecified: Secondary | ICD-10-CM | POA: Diagnosis not present

## 2023-05-18 DIAGNOSIS — D1801 Hemangioma of skin and subcutaneous tissue: Secondary | ICD-10-CM | POA: Diagnosis not present

## 2023-05-18 DIAGNOSIS — L720 Epidermal cyst: Secondary | ICD-10-CM | POA: Diagnosis not present

## 2023-05-29 ENCOUNTER — Other Ambulatory Visit: Payer: Self-pay | Admitting: Podiatry

## 2023-05-29 ENCOUNTER — Encounter: Payer: Self-pay | Admitting: Podiatry

## 2023-05-29 DIAGNOSIS — M722 Plantar fascial fibromatosis: Secondary | ICD-10-CM

## 2023-06-02 ENCOUNTER — Other Ambulatory Visit: Payer: Self-pay | Admitting: Podiatry

## 2023-06-02 MED ORDER — HYDROCODONE-ACETAMINOPHEN 5-325 MG PO TABS: 1.0000 | ORAL_TABLET | Freq: Four times a day (QID) | ORAL | 0 refills | Status: DC | PRN

## 2023-06-17 ENCOUNTER — Ambulatory Visit
Admission: RE | Admit: 2023-06-17 | Discharge: 2023-06-17 | Disposition: A | Discharge status: Home / Self Care | Payer: Commercial Managed Care - PPO | Source: Ambulatory Visit | Attending: Podiatry | Admitting: Podiatry

## 2023-06-17 DIAGNOSIS — M7662 Achilles tendinitis, left leg: Secondary | ICD-10-CM | POA: Diagnosis not present

## 2023-06-17 DIAGNOSIS — M722 Plantar fascial fibromatosis: Secondary | ICD-10-CM

## 2023-06-21 ENCOUNTER — Encounter: Payer: Self-pay | Admitting: Podiatry

## 2023-06-22 NOTE — Telephone Encounter (Signed)
 Dawn, can you schedule her for next Friday? Thanks!

## 2023-06-23 ENCOUNTER — Telehealth: Payer: Self-pay | Admitting: Podiatry

## 2023-06-23 NOTE — Telephone Encounter (Signed)
 Lvm for pt that I have scheduled her for 2/14 at 1215 and to call if that does not work for her

## 2023-06-30 ENCOUNTER — Ambulatory Visit: Payer: Commercial Managed Care - PPO | Admitting: Podiatry

## 2023-07-07 ENCOUNTER — Ambulatory Visit: Payer: Commercial Managed Care - PPO | Admitting: Internal Medicine

## 2023-08-04 ENCOUNTER — Other Ambulatory Visit: Payer: Self-pay | Admitting: Internal Medicine

## 2023-08-04 DIAGNOSIS — M109 Gout, unspecified: Secondary | ICD-10-CM

## 2023-08-04 DIAGNOSIS — I10 Essential (primary) hypertension: Secondary | ICD-10-CM

## 2023-08-07 ENCOUNTER — Other Ambulatory Visit: Payer: Self-pay

## 2023-08-07 MED ORDER — ALLOPURINOL 300 MG PO TABS
300.0000 mg | ORAL_TABLET | Freq: Every day | ORAL | 0 refills | Status: DC
  Filled 2023-08-07: qty 90, 90d supply, fill #0

## 2023-08-07 MED ORDER — TELMISARTAN-HCTZ 80-25 MG PO TABS
1.0000 | ORAL_TABLET | Freq: Every day | ORAL | 0 refills | Status: DC
  Filled 2023-08-07: qty 90, 90d supply, fill #0

## 2023-09-01 ENCOUNTER — Ambulatory Visit: Payer: Commercial Managed Care - PPO | Admitting: Internal Medicine

## 2023-09-17 ENCOUNTER — Ambulatory Visit
Admission: EM | Admit: 2023-09-17 | Discharge: 2023-09-17 | Disposition: A | Discharge status: Home / Self Care | Attending: Family Medicine | Admitting: Family Medicine

## 2023-09-17 DIAGNOSIS — J209 Acute bronchitis, unspecified: Secondary | ICD-10-CM

## 2023-09-17 DIAGNOSIS — J3089 Other allergic rhinitis: Secondary | ICD-10-CM

## 2023-09-17 MED ORDER — PROMETHAZINE-DM 6.25-15 MG/5ML PO SYRP: 5.0000 mL | ORAL_SOLUTION | Freq: Four times a day (QID) | ORAL | 0 refills | Status: DC | PRN

## 2023-09-17 MED ORDER — AZELASTINE HCL 0.1 % NA SOLN: 1.0000 | Freq: Two times a day (BID) | NASAL | 0 refills | Status: AC

## 2023-09-17 MED ORDER — DEXAMETHASONE SODIUM PHOSPHATE 10 MG/ML IJ SOLN
10.0000 mg | Freq: Once | INTRAMUSCULAR | Status: AC
  Administered 2023-09-17: 10 mg via INTRAMUSCULAR

## 2023-09-17 NOTE — ED Triage Notes (Signed)
 Pt reports x 2 weeks ago , she started having allergy sx's after being in the pollen and wind while working in the yard, had cough and sore throat used OTC meds. Friday she noticed sinus drainage, cough is non productive, right ear feels full.

## 2023-09-17 NOTE — ED Provider Notes (Signed)
 [facility]    CSN: [id number] Arrival date & time: 09/17/23  1512      History   Chief Complaint No chief complaint on file.   HPI Renee Alvarez is a 63 y.o. female.   Patient presenting today with about 2 weeks of ongoing sinus pressure, rhinorrhea, postnasal drainage, hacking dry cough, ear fullness.  Denies fever, chills, body aches, chest pain, shortness of breath, abdominal pain, vomiting, diarrhea.  So far trying Zyrtec, nasal sprays, over-the-counter cold and congestion medication with minimal relief.  Tree of seasonal allergies that always flareup in the spring and fall per patient.    Past Medical History:  Diagnosis Date   Allergic rhinitis due to pollen    Anxiety    Arthritis    oa lumbar region   Breast cyst, right 05/23/2019   Chronic back pain    Essential (primary) hypertension    GERD (gastroesophageal reflux disease)    Gout last flare up jan 2022   Headache    Heart murmur    told years ago had mild murmur none heard recently per pt   History of hiatal hernia    Hypertension    IBS (irritable bowel syndrome)    ibsd per pt   Low back pain    Obesity, unspecified    PMB (postmenopausal bleeding)    PONV (postoperative nausea and vomiting)    slower to awkaen after cholecystectomy   Seasonal allergies    Skin irritation    both breats where bra rubs healing well using fluticasone prn per pt   Toenail fungus    left great toe using compounded med for   Wears contact lenses    Wears glasses    Zoster without complications     Patient Active Problem List   Diagnosis Date Noted   Prediabetes 12/30/2022   Need for Tdap vaccination 12/30/2022   Encounter for annual general medical examination with abnormal findings in adult 04/01/2022   Anxiety 09/07/2021   DDD (degenerative disc disease), lumbar 09/07/2021   Primary osteoarthritis involving multiple joints 09/07/2021   Obesity (BMI 30-39.9) 10/08/2019   Postmenopausal bleeding  10/08/2019   Gastroesophageal reflux disease without esophagitis 05/23/2019   Allergic rhinitis due to pollen    Essential (primary) hypertension     Past Surgical History:  Procedure Laterality Date   BALLOON DILATION  05/04/2012   Procedure: BALLOON DILATION;  Surgeon: Ruby Corporal, MD;  Location: [facility];  Service: Endoscopy;  Laterality: N/A;   CESAREAN SECTION  1997   CHOLECYSTECTOMY  yrs ago   laparoscopic   COLONOSCOPY WITH ESOPHAGOGASTRODUODENOSCOPY (EGD)  05/04/2012   Procedure: COLONOSCOPY WITH ESOPHAGOGASTRODUODENOSCOPY (EGD);  Surgeon: Ruby Corporal, MD;  Location: [facility];  Service: Endoscopy;  Laterality: N/A;  730   Cyst removed from right finger  yrs ago   CYSTOSCOPY  08/17/2020   Procedure: CYSTOSCOPY;  Surgeon: Matt Song, MD;  Location: [facility];  Service: Gynecology;;   Expoloratory Lap  [phone]   MALONEY DILATION  05/04/2012   Procedure: Londa Rival DILATION;  Surgeon: Ruby Corporal, MD;  Location: [facility];  Service: Endoscopy;  Laterality: N/A;   ROBOTIC ASSISTED LAPAROSCOPIC HYSTERECTOMY AND SALPINGECTOMY Bilateral 08/17/2020   Procedure: XI ROBOTIC ASSISTED LAPAROSCOPIC HYSTERECTOMY AND SALPINGOOOPHORECTOMY;  Surgeon: Matt Song, MD;  Location: [facility];  Service: Gynecology;  Laterality: Bilateral;   SAVORY DILATION  05/04/2012   Procedure: SAVORY DILATION;  Surgeon: Ruby Corporal,  MD;  Location: [facility];  Service: Endoscopy;  Laterality: N/A;    OB History     Gravida  2   Para  2   Term      Preterm      AB      Living  2      SAB      IAB      Ectopic      Multiple      Live Births               Home Medications    Prior to Admission medications   Medication Sig Start Date End Date Taking? Authorizing Provider  azelastine (ASTELIN) 0.1 % nasal spray Place 1 spray into both nostrils 2 (two) times daily. Use in each nostril as directed 09/17/23  Yes Corbin Dess, PA-C  promethazine -dextromethorphan (PROMETHAZINE -DM) 6.25-15 MG/5ML syrup Take 5 mLs by mouth 4 (four) times daily as needed. 09/17/23  Yes Corbin Dess, PA-C  acetaminophen  (TYLENOL ) 500 MG tablet Take 500 mg by mouth every 6 (six) hours as needed.    [provider]  allopurinol  (ZYLOPRIM ) 300 MG tablet Take 1 tablet (300 mg) by mouth daily. 08/07/23   Tobi Fortes, MD  azithromycin  (ZITHROMAX  Z-PAK) 250 MG tablet Take 2 tablets (500 mg) PO today, then 1 tablet (250 mg) PO daily x4 days. Patient not taking: Reported on 04/03/2023 03/07/23   Tobi Fortes, MD  HYDROcodone -acetaminophen  (NORCO/VICODIN) 5-325 MG tablet Take 1 tablet by mouth every 6 (six) hours as needed. 06/02/23   Charity Conch, DPM  hydrOXYzine  (VISTARIL ) 25 MG capsule Take 1 capsule (25 mg total) by mouth every 8 (eight) hours as needed. Patient not taking: Reported on 04/03/2023 07/01/22   Tobi Fortes, MD  ibuprofen  (ADVIL ) 200 MG tablet Take 200 mg by mouth every 6 (six) hours as needed.    [provider]  ketoconazole  (NIZORAL ) 2 % cream Apply 1 Application topically to affected area daily. 11/14/22     levocetirizine (XYZAL ) 5 MG tablet Take 10 mg by mouth daily. Takes 2 of 10 mg in am per pt    [provider]  meloxicam  (MOBIC ) 15 MG tablet Take 1 tablet (15 mg total) by mouth daily. Patient not taking: Reported on 04/03/2023 01/27/23 01/27/24  Charity Conch, DPM  methylPREDNISolone  (MEDROL  DOSEPAK) 4 MG TBPK tablet Take as directed 04/10/23   Charity Conch, DPM  Sodium Sulfate-Mag Sulfate-KCl (SUTAB ) 1479-225-188 MG TABS Take 12 tablets by mouth as directed. 04/03/23   Lajuan Pila, MD  telmisartan -hydrochlorothiazide  (MICARDIS  HCT) 80-25 MG tablet Take 1 tablet by mouth daily. 08/07/23   Tobi Fortes, MD  Vitamin D , Ergocalciferol , (DRISDOL ) 1.25 MG (50000 UNIT) CAPS capsule Take 1 capsule by mouth every 7 (seven) days.    [provider]  progesterone  (PROMETRIUM ) 200 MG capsule Take 200 mg by mouth at bedtime. 05/21/20 09/01/20  [provider]    Family History Family History  Problem Relation Age of Onset   Cancer Mother        breast   Breast cancer Mother 47   Cancer Father        prostate   Diabetes Sister    Hypertension Sister    Heart disease Maternal Grandfather    Other Paternal Grandfather        cerebral hemorrhage   Colon cancer Neg Hx    Colon polyps Neg Hx  Esophageal cancer Neg Hx    Stomach cancer Neg Hx    Rectal cancer Neg Hx     Social History Social History   Tobacco Use   Smoking status: Never   Smokeless tobacco: Never  Vaping Use   Vaping status: Never Used  Substance Use Topics   Alcohol use: No    Alcohol/week: 0.0 standard drinks of alcohol   Drug use: No     Allergies   Penicillins   Review of Systems Review of Systems Per HPI  Physical Exam Triage Vital Signs ED Triage Vitals  Encounter Vitals Group     BP 09/17/23 1523 134/83     Systolic BP Percentile --      Diastolic BP Percentile --      Pulse Rate 09/17/23 1523 (!) 110     Resp 09/17/23 1523 18     Temp 09/17/23 1523 98.6 F (37 C)     Temp Source 09/17/23 1523 Oral     SpO2 09/17/23 1523 96 %     Weight --      Height --      Head Circumference --      Peak Flow --      Pain Score 09/17/23 1526 0     Pain Loc --      Pain Education --      Exclude from Growth Chart --    No data found.  Updated Vital Signs BP 134/83 (BP Location: Right Arm)   Pulse (!) 110   Temp 98.6 F (37 C) (Oral)   Resp 18   SpO2 96%   Visual Acuity Right Eye Distance:   Left Eye Distance:   Bilateral Distance:    Right Eye Near:   Left Eye Near:    Bilateral Near:     Physical Exam Vitals and nursing note reviewed.  Constitutional:      Appearance: Normal appearance. She is not ill-appearing.  HENT:     Head: Atraumatic.     Right Ear: Tympanic membrane and external ear normal.     Left  Ear: Tympanic membrane and external ear normal.     Nose: Rhinorrhea present.     Mouth/Throat:     Mouth: Mucous membranes are moist.     Pharynx: Posterior oropharyngeal erythema present.  Eyes:     Extraocular Movements: Extraocular movements intact.     Conjunctiva/sclera: Conjunctivae normal.  Cardiovascular:     Rate and Rhythm: Normal rate and regular rhythm.     Heart sounds: Normal heart sounds.  Pulmonary:     Effort: Pulmonary effort is normal.     Breath sounds: Normal breath sounds. No wheezing or rales.  Musculoskeletal:        General: Normal range of motion.     Cervical back: Normal range of motion and neck supple.  Skin:    General: Skin is warm and dry.  Neurological:     Mental Status: She is alert and oriented to person, place, and time.  Psychiatric:        Mood and Affect: Mood normal.        Thought Content: Thought content normal.        Judgment: Judgment normal.      UC Treatments / Results  Labs (all labs ordered are listed, but only abnormal results are displayed) Labs Reviewed - No data to display  EKG   Radiology No results found.  Procedures Procedures (including critical care time)  Medications  Ordered in UC Medications  dexamethasone  (DECADRON ) injection 10 mg (has no administration in time range)    Initial Impression / Assessment and Plan / UC Course  I have reviewed the triage vital signs and the nursing notes.  Pertinent labs & imaging results that were available during my care of the patient were reviewed by me and considered in my medical decision making (see chart for details).     Mildly tachycardic in triage and otherwise vital signs reassuring.  She is overall well-appearing and in no acute distress.  Suspect allergic bronchitis.  Treat with continuation of allergy regimen, IM Decadron , Phenergan  DM, Astelin nasal spray.  Supportive over-the-counter medications and home care reviewed.  Final Clinical Impressions(s) /  UC Diagnoses   Final diagnoses:  Acute bronchitis, unspecified organism  Seasonal allergic rhinitis due to other allergic trigger   Discharge Instructions   None    ED Prescriptions     Medication Sig Dispense Auth. Provider   azelastine (ASTELIN) 0.1 % nasal spray Place 1 spray into both nostrils 2 (two) times daily. Use in each nostril as directed 30 mL Corbin Dess, PA-C   promethazine -dextromethorphan (PROMETHAZINE -DM) 6.25-15 MG/5ML syrup Take 5 mLs by mouth 4 (four) times daily as needed. 100 mL Corbin Dess, [place]      PDMP not reviewed this encounter.   Corbin Dess, [place] 09/17/23 1545

## 2023-09-27 ENCOUNTER — Encounter: Payer: Self-pay | Admitting: Emergency Medicine

## 2023-09-27 ENCOUNTER — Ambulatory Visit
Admission: EM | Admit: 2023-09-27 | Discharge: 2023-09-27 | Disposition: A | Discharge status: Home / Self Care | Attending: Nurse Practitioner | Admitting: Nurse Practitioner

## 2023-09-27 ENCOUNTER — Other Ambulatory Visit: Payer: Self-pay

## 2023-09-27 DIAGNOSIS — J22 Unspecified acute lower respiratory infection: Secondary | ICD-10-CM

## 2023-09-27 MED ORDER — ALBUTEROL SULFATE HFA 108 (90 BASE) MCG/ACT IN AERS: 2.0000 | INHALATION_SPRAY | Freq: Four times a day (QID) | RESPIRATORY_TRACT | 0 refills | Status: AC | PRN

## 2023-09-27 MED ORDER — PREDNISONE 20 MG PO TABS: 40.0000 mg | ORAL_TABLET | Freq: Every day | ORAL | 0 refills | Status: AC

## 2023-09-27 MED ORDER — METHYLPREDNISOLONE SODIUM SUCC 125 MG IJ SOLR
125.0000 mg | Freq: Once | INTRAMUSCULAR | Status: AC
  Administered 2023-09-27: 125 mg via INTRAMUSCULAR

## 2023-09-27 MED ORDER — AZITHROMYCIN 250 MG PO TABS: 250.0000 mg | ORAL_TABLET | Freq: Every day | ORAL | 0 refills | Status: DC

## 2023-09-27 NOTE — Discharge Instructions (Addendum)
 You were given an injection of Solu-Medrol  125 mg today.  Start the prednisone  on 09/28/2023.  Do not take any additional ibuprofen  today, you may take over-the-counter Tylenol  for breakthrough pain or discomfort. Take medication as prescribed.  Recommend over-the-counter Coricidin HBP for your cough. Increase fluids and allow for plenty of rest. May take over-the-counter Tylenol  as needed for pain, fever, or general discomfort.  You can resume ibuprofen  when you complete the prednisone . Recommend use of a humidifier in your bedroom at nighttime during sleep and sleeping elevated on pillows while cough symptoms persist. As discussed, if symptoms do not improve with this treatment, it is recommended that you follow-up with your primary care physician for further evaluation. Follow-up as needed.

## 2023-09-27 NOTE — ED Triage Notes (Addendum)
 Pt reports was dx with bronchitis approx 10 days ago. Reports continued "breathing fits" and reports "intermittent wheezing." NAD noted.

## 2023-09-27 NOTE — ED Provider Notes (Signed)
 [facility]    CSN: [id number] Arrival date & time: 09/27/23  1717      History   Chief Complaint Chief Complaint  Patient presents with   Cough    HPI Renee Alvarez is a 63 y.o. female.   The history is provided by the patient.   Patient presents for complaints of cough and wheezing that has been present for the past 5 to 6 weeks.  Patient was seen in this clinic on 09/17/2023 and diagnosed with acute bronchitis with underlying allergic rhinitis.  Patient states she was given an injection of Decadron  and prescribed Astelin  nasal spray and promethazine  DM for the cough.  Patient states she has been taking the medication and continuing her allergy regimen; however, the cough has remained persistent.  She denies any fever, chills, headache, ear pain, nasal congestion, runny nose, or difficulty breathing.  States that she is concerned because the cough has not gotten any better.  Past Medical History:  Diagnosis Date   Allergic rhinitis due to pollen    Anxiety    Arthritis    oa lumbar region   Breast cyst, right 05/23/2019   Chronic back pain    Essential (primary) hypertension    GERD (gastroesophageal reflux disease)    Gout last flare up jan 2022   Headache    Heart murmur    told years ago had mild murmur none heard recently per pt   History of hiatal hernia    Hypertension    IBS (irritable bowel syndrome)    ibsd per pt   Low back pain    Obesity, unspecified    PMB (postmenopausal bleeding)    PONV (postoperative nausea and vomiting)    slower to awkaen after cholecystectomy   Seasonal allergies    Skin irritation    both breats where bra rubs healing well using fluticasone prn per pt   Toenail fungus    left great toe using compounded med for   Wears contact lenses    Wears glasses    Zoster without complications     Patient Active Problem List   Diagnosis Date Noted   Prediabetes 12/30/2022   Need for Tdap vaccination 12/30/2022   Encounter  for annual general medical examination with abnormal findings in adult 04/01/2022   Anxiety 09/07/2021   DDD (degenerative disc disease), lumbar 09/07/2021   Primary osteoarthritis involving multiple joints 09/07/2021   Obesity (BMI 30-39.9) 10/08/2019   Postmenopausal bleeding 10/08/2019   Gastroesophageal reflux disease without esophagitis 05/23/2019   Allergic rhinitis due to pollen    Essential (primary) hypertension     Past Surgical History:  Procedure Laterality Date   BALLOON DILATION  05/04/2012   Procedure: BALLOON DILATION;  Surgeon: Ruby Corporal, MD;  Location: [facility];  Service: Endoscopy;  Laterality: N/A;   CESAREAN SECTION  1997   CHOLECYSTECTOMY  yrs ago   laparoscopic   COLONOSCOPY WITH ESOPHAGOGASTRODUODENOSCOPY (EGD)  05/04/2012   Procedure: COLONOSCOPY WITH ESOPHAGOGASTRODUODENOSCOPY (EGD);  Surgeon: Ruby Corporal, MD;  Location: [facility];  Service: Endoscopy;  Laterality: N/A;  730   Cyst removed from right finger  yrs ago   CYSTOSCOPY  08/17/2020   Procedure: CYSTOSCOPY;  Surgeon: Matt Song, MD;  Location: [facility];  Service: Gynecology;;   Expoloratory Lap  [phone]   MALONEY DILATION  05/04/2012   Procedure: Londa Rival DILATION;  Surgeon: Ruby Corporal, MD;  Location: [facility];  Service: Endoscopy;  Laterality: N/A;   ROBOTIC ASSISTED LAPAROSCOPIC HYSTERECTOMY AND SALPINGECTOMY Bilateral 08/17/2020   Procedure: XI ROBOTIC ASSISTED LAPAROSCOPIC HYSTERECTOMY AND SALPINGOOOPHORECTOMY;  Surgeon: Matt Song, MD;  Location: [facility];  Service: Gynecology;  Laterality: Bilateral;   SAVORY DILATION  05/04/2012   Procedure: SAVORY DILATION;  Surgeon: Ruby Corporal, MD;  Location: [facility];  Service: Endoscopy;  Laterality: N/A;    OB History     Gravida  2   Para  2   Term      Preterm      AB      Living  2      SAB      IAB      Ectopic      Multiple      Live Births                Home Medications    Prior to Admission medications   Medication Sig Start Date End Date Taking? Authorizing Provider  albuterol  (VENTOLIN  HFA) 108 (90 Base) MCG/ACT inhaler Inhale 2 puffs into the lungs every 6 (six) hours as needed. 09/27/23  Yes Leath-Warren, Belen Bowers, NP  azithromycin  (ZITHROMAX ) 250 MG tablet Take 1 tablet (250 mg total) by mouth daily. Take first 2 tablets together, then 1 every day until finished. 09/27/23  Yes Leath-Warren, Belen Bowers, NP  predniSONE  (DELTASONE ) 20 MG tablet Take 2 tablets (40 mg total) by mouth daily with breakfast for 5 days. 09/27/23 10/02/23 Yes Leath-Warren, Belen Bowers, NP  acetaminophen  (TYLENOL ) 500 MG tablet Take 500 mg by mouth every 6 (six) hours as needed.    [provider]  allopurinol  (ZYLOPRIM ) 300 MG tablet Take 1 tablet (300 mg) by mouth daily. 08/07/23   Tobi Fortes, MD  azelastine  (ASTELIN ) 0.1 % nasal spray Place 1 spray into both nostrils 2 (two) times daily. Use in each nostril as directed 09/17/23   Corbin Dess, PA-C  HYDROcodone -acetaminophen  (NORCO/VICODIN) 5-325 MG tablet Take 1 tablet by mouth every 6 (six) hours as needed. 06/02/23   Charity Conch, DPM  hydrOXYzine  (VISTARIL ) 25 MG capsule Take 1 capsule (25 mg total) by mouth every 8 (eight) hours as needed. Patient not taking: Reported on 04/03/2023 07/01/22   Tobi Fortes, MD  ibuprofen  (ADVIL ) 200 MG tablet Take 200 mg by mouth every 6 (six) hours as needed.    [provider]  ketoconazole  (NIZORAL ) 2 % cream Apply 1 Application topically to affected area daily. 11/14/22     levocetirizine (XYZAL ) 5 MG tablet Take 10 mg by mouth daily. Takes 2 of 10 mg in am per pt    [provider]  meloxicam  (MOBIC ) 15 MG tablet Take 1 tablet (15 mg total) by mouth daily. Patient not taking: Reported on 04/03/2023 01/27/23 01/27/24  Charity Conch, DPM  methylPREDNISolone  (MEDROL  DOSEPAK) 4 MG TBPK tablet Take as directed  04/10/23   Charity Conch, DPM  promethazine -dextromethorphan (PROMETHAZINE -DM) 6.25-15 MG/5ML syrup Take 5 mLs by mouth 4 (four) times daily as needed. 09/17/23   Corbin Dess, PA-C  Sodium Sulfate-Mag Sulfate-KCl (SUTAB ) [phone] MG TABS Take 12 tablets by mouth as directed. 04/03/23   Lajuan Pila, MD  telmisartan -hydrochlorothiazide  (MICARDIS  HCT) 80-25 MG tablet Take 1 tablet by mouth daily. 08/07/23   Tobi Fortes, MD  Vitamin D , Ergocalciferol , (DRISDOL ) 1.25 MG (50000 UNIT) CAPS capsule Take 1 capsule by mouth every 7 (seven) days.    [provider]  progesterone  (  PROMETRIUM ) 200 MG capsule Take 200 mg by mouth at bedtime. 05/21/20 09/01/20  [provider]    Family History Family History  Problem Relation Age of Onset   Cancer Mother        breast   Breast cancer Mother 20   Cancer Father        prostate   Diabetes Sister    Hypertension Sister    Heart disease Maternal Grandfather    Other Paternal Grandfather        cerebral hemorrhage   Colon cancer Neg Hx    Colon polyps Neg Hx    Esophageal cancer Neg Hx    Stomach cancer Neg Hx    Rectal cancer Neg Hx     Social History Social History   Tobacco Use   Smoking status: Never   Smokeless tobacco: Never  Vaping Use   Vaping status: Never Used  Substance Use Topics   Alcohol use: No    Alcohol/week: 0.0 standard drinks of alcohol   Drug use: No     Allergies   Penicillins   Review of Systems Review of Systems Per HPI  Physical Exam Triage Vital Signs ED Triage Vitals [09/27/23 1731]  Encounter Vitals Group     BP (!) 146/91     Systolic BP Percentile      Diastolic BP Percentile      Pulse Rate 75     Resp 20     Temp 98.1 F (36.7 C)     Temp Source Oral     SpO2 96 %     Weight      Height      Head Circumference      Peak Flow      Pain Score      Pain Loc      Pain Education      Exclude from Growth Chart    No data found.  Updated Vital  Signs BP (!) 146/91 (BP Location: Right Arm)   Pulse 75   Temp 98.1 F (36.7 C) (Oral)   Resp 20   SpO2 96%   Visual Acuity Right Eye Distance:   Left Eye Distance:   Bilateral Distance:    Right Eye Near:   Left Eye Near:    Bilateral Near:     Physical Exam Vitals and nursing note reviewed.  Constitutional:      General: She is not in acute distress.    Appearance: Normal appearance.  HENT:     Head: Normocephalic.     Nose: Nose normal.     Mouth/Throat:     Mouth: Mucous membranes are moist.  Eyes:     Extraocular Movements: Extraocular movements intact.     Conjunctiva/sclera: Conjunctivae normal.     Pupils: Pupils are equal, round, and reactive to light.  Cardiovascular:     Rate and Rhythm: Normal rate and regular rhythm.     Pulses: Normal pulses.     Heart sounds: Normal heart sounds.  Pulmonary:     Effort: Pulmonary effort is normal. No respiratory distress.     Breath sounds: Normal breath sounds. No stridor. No wheezing, rhonchi or rales.  Abdominal:     General: Bowel sounds are normal.     Palpations: Abdomen is soft.     Tenderness: There is no abdominal tenderness.  Musculoskeletal:     Cervical back: Normal range of motion.  Skin:    General: Skin is warm and dry.  Neurological:     General: No focal deficit present.     Mental Status: She is alert and oriented to person, place, and time.  Psychiatric:        Mood and Affect: Mood normal.        Behavior: Behavior normal.      UC Treatments / Results  Labs (all labs ordered are listed, but only abnormal results are displayed) Labs Reviewed - No data to display  EKG   Radiology No results found.  Procedures Procedures (including critical care time)  Medications Ordered in UC Medications  methylPREDNISolone  sodium succinate (SOLU-MEDROL ) 125 mg/2 mL injection 125 mg (125 mg Intramuscular Given 09/27/23 1749)    Initial Impression / Assessment and Plan / UC Course  I have  reviewed the triage vital signs and the nursing notes.  Pertinent labs & imaging results that were available during my care of the patient were reviewed by me and considered in my medical decision making (see chart for details).  Patient with ongoing cough and chest congestion that been present for the past several weeks.  On exam, lung sounds are clear throughout, room air sats at 96%.  Patient was treated on 5/4 with Promethazine  DM and Astelin  nasal spray.  States she has been taking that medication, but no improvement of symptoms.  Solu-Medrol  125 mg IM administered.  Will start patient on azithromycin  250 mg for lower respiratory infection, and albuterol  inhaler as needed for cough and wheezing, and prednisone  40 mg for the next 5 days.  Supportive care recommendations were provided and discussed with the patient to include fluids, rest, over-the-counter analgesics, and use of a humidifier during sleep.  Patient was advised to follow-up with her PCP if symptoms fail to improve with this treatment.  Patient was in agreement with this plan of care and verbalizes understanding.  All questions were answered.  Patient stable for discharge.   Final Clinical Impressions(s) / UC Diagnoses   Final diagnoses:  Lower respiratory infection     Discharge Instructions      You were given an injection of Solu-Medrol  125 mg today.  Start the prednisone  on 09/28/2023.  Do not take any additional ibuprofen  today, you may take over-the-counter Tylenol  for breakthrough pain or discomfort. Take medication as prescribed.  Recommend over-the-counter Coricidin HBP for your cough. Increase fluids and allow for plenty of rest. May take over-the-counter Tylenol  as needed for pain, fever, or general discomfort.  You can resume ibuprofen  when you complete the prednisone . Recommend use of a humidifier in your bedroom at nighttime during sleep and sleeping elevated on pillows while cough symptoms persist. As discussed,  if symptoms do not improve with this treatment, it is recommended that you follow-up with your primary care physician for further evaluation. Follow-up as needed.   ED Prescriptions     Medication Sig Dispense Auth. Provider   azithromycin  (ZITHROMAX ) 250 MG tablet Take 1 tablet (250 mg total) by mouth daily. Take first 2 tablets together, then 1 every day until finished. 6 tablet Leath-Warren, Belen Bowers, NP   albuterol  (VENTOLIN  HFA) 108 (90 Base) MCG/ACT inhaler Inhale 2 puffs into the lungs every 6 (six) hours as needed. 8 g Leath-Warren, Belen Bowers, NP   predniSONE  (DELTASONE ) 20 MG tablet Take 2 tablets (40 mg total) by mouth daily with breakfast for 5 days. 10 tablet Leath-Warren, Belen Bowers, NP      PDMP not reviewed this encounter.   Hardy Lia, NP 09/27/23 1754

## 2023-11-02 ENCOUNTER — Other Ambulatory Visit: Payer: Self-pay | Admitting: Internal Medicine

## 2023-11-02 ENCOUNTER — Other Ambulatory Visit: Payer: Self-pay

## 2023-11-02 DIAGNOSIS — M109 Gout, unspecified: Secondary | ICD-10-CM

## 2023-11-02 DIAGNOSIS — I10 Essential (primary) hypertension: Secondary | ICD-10-CM

## 2023-11-02 MED ORDER — ALLOPURINOL 300 MG PO TABS
300.0000 mg | ORAL_TABLET | Freq: Every day | ORAL | 0 refills | Status: DC
  Filled 2023-11-02: qty 90, 90d supply, fill #0

## 2023-11-02 MED ORDER — TELMISARTAN-HCTZ 80-25 MG PO TABS
1.0000 | ORAL_TABLET | Freq: Every day | ORAL | 0 refills | Status: DC
  Filled 2023-11-02: qty 90, 90d supply, fill #0

## 2023-11-08 ENCOUNTER — Ambulatory Visit: Admitting: Internal Medicine

## 2023-12-01 ENCOUNTER — Encounter: Payer: Self-pay | Admitting: Advanced Practice Midwife

## 2023-12-22 DIAGNOSIS — Z01419 Encounter for gynecological examination (general) (routine) without abnormal findings: Secondary | ICD-10-CM | POA: Diagnosis not present

## 2023-12-22 DIAGNOSIS — Z1231 Encounter for screening mammogram for malignant neoplasm of breast: Secondary | ICD-10-CM | POA: Diagnosis not present

## 2023-12-22 LAB — HM MAMMOGRAPHY

## 2024-01-10 ENCOUNTER — Other Ambulatory Visit: Payer: Self-pay

## 2024-01-10 ENCOUNTER — Ambulatory Visit (INDEPENDENT_AMBULATORY_CARE_PROVIDER_SITE_OTHER): Admitting: Internal Medicine

## 2024-01-10 ENCOUNTER — Encounter: Payer: Self-pay | Admitting: Internal Medicine

## 2024-01-10 ENCOUNTER — Other Ambulatory Visit (HOSPITAL_COMMUNITY): Payer: Self-pay

## 2024-01-10 VITALS — BP 120/72 | HR 80 | Wt 242.0 lb

## 2024-01-10 DIAGNOSIS — E785 Hyperlipidemia, unspecified: Secondary | ICD-10-CM | POA: Diagnosis not present

## 2024-01-10 DIAGNOSIS — M51362 Other intervertebral disc degeneration, lumbar region with discogenic back pain and lower extremity pain: Secondary | ICD-10-CM | POA: Diagnosis not present

## 2024-01-10 DIAGNOSIS — M109 Gout, unspecified: Secondary | ICD-10-CM | POA: Insufficient documentation

## 2024-01-10 DIAGNOSIS — I10 Essential (primary) hypertension: Secondary | ICD-10-CM

## 2024-01-10 DIAGNOSIS — Z0001 Encounter for general adult medical examination with abnormal findings: Secondary | ICD-10-CM | POA: Diagnosis not present

## 2024-01-10 DIAGNOSIS — E559 Vitamin D deficiency, unspecified: Secondary | ICD-10-CM | POA: Diagnosis not present

## 2024-01-10 DIAGNOSIS — Z23 Encounter for immunization: Secondary | ICD-10-CM | POA: Diagnosis not present

## 2024-01-10 DIAGNOSIS — F419 Anxiety disorder, unspecified: Secondary | ICD-10-CM | POA: Diagnosis not present

## 2024-01-10 DIAGNOSIS — R7303 Prediabetes: Secondary | ICD-10-CM | POA: Diagnosis not present

## 2024-01-10 MED ORDER — BUPROPION HCL ER (XL) 150 MG PO TB24: 150.0000 mg | ORAL_TABLET | Freq: Every day | ORAL | 5 refills | Status: AC

## 2024-01-10 MED ORDER — VALSARTAN-HYDROCHLOROTHIAZIDE 160-25 MG PO TABS
1.0000 | ORAL_TABLET | Freq: Every day | ORAL | 3 refills | Status: AC
  Filled 2024-01-10: qty 90, 90d supply, fill #0
  Filled 2024-04-04: qty 90, 90d supply, fill #1

## 2024-01-10 MED ORDER — ALLOPURINOL 300 MG PO TABS
300.0000 mg | ORAL_TABLET | Freq: Every day | ORAL | 3 refills | Status: AC
  Filled 2024-01-10 – 2024-01-29 (×2): qty 90, 90d supply, fill #0
  Filled 2024-05-08: qty 90, 90d supply, fill #1

## 2024-01-10 NOTE — Patient Instructions (Signed)
 Please start taking Valsartan -HCTZ 160-25 mg once daily instead of Telmisartan -HCTZ.  Please start taking Wellbutrin  half tablet for 1 week and then 1 tablet once daily after it.  Please continue to take medications as prescribed.  Please continue to follow low carb diet and perform moderate exercise/walking at least 150 mins/week.

## 2024-01-10 NOTE — Assessment & Plan Note (Signed)
 Lab Results  Component Value Date   HGBA1C 5.6 12/30/2022   Advised to follow low carb diet

## 2024-01-10 NOTE — Assessment & Plan Note (Addendum)
 BP Readings from Last 1 Encounters:  01/10/24 120/72   Well-controlled with telmisartan -HCTZ 80-25 mg QD Switched to valsartan -HCTZ 160-25 mg QD due to cost concern Counseled for compliance with the medications Advised DASH diet and moderate exercise/walking, at least 150 mins/week

## 2024-01-10 NOTE — Progress Notes (Signed)
 Established Patient Office Visit  Subjective:  Patient ID: Renee Alvarez, female    DOB: [birth year]  Age: 63 y.o. MRN: [id number]  CC:  Chief Complaint  Patient presents with   Annual Exam    Cpe, yearly check up.    Anxiety    Would like an rx to help with this.     HPI Renee Alvarez is a 63 y.o. female with past medical history of HTN, GERD, GAD, chronic low back pain and morbid obesity who presents for annual physical.  HTN: BP is well-controlled. Takes telmisartan -HCTZ 80-25 mg QD regularly, but is expensive. Patient denies headache, dizziness, chest pain, dyspnea or palpitations.  GAD: He reports recent worsening of anxiety, partly because of stress regarding her daughter.  She has overeating spells due to it.  She used to take Lexapro  for GAD, but states that she took it as as needed.  She was given hydroxyzine  by Dr. Melvenia, but she had drowsiness with it.  Denies any anhedonia, SI or HI currently.  Obesity: She has been trying to do low-carb diet and has been watching over portions of her diet.  She admits that she needs to be consistent with her diet.  She has tried Zepbound  in the past, had tolerated it well, but could not continue due to insurance coverage concern.  Back pain: Complains of chronic, intermittent low back pain, which is worse with prolonged standing and bending.  She has seen orthopedic surgeon in [place], and was told of arthritis in lumbar spine.  She denies any heavy lifting, recent injury or fall.  Denies any numbness or weakness of the LE.  She takes Tylenol  and ibuprofen  as needed for low back pain currently.  She used to take hydrocodone  for severe pain in the past, but required it very infrequently.  Past Medical History:  Diagnosis Date   Allergic rhinitis due to pollen    Anxiety    Arthritis    oa lumbar region   Breast cyst, right 05/23/2019   Chronic back pain    Essential (primary) hypertension    GERD (gastroesophageal reflux disease)    Gout  last flare up jan 2022   Headache    Heart murmur    told years ago had mild murmur none heard recently per pt   History of hiatal hernia    Hypertension    IBS (irritable bowel syndrome)    ibsd per pt   Low back pain    Obesity, unspecified    PMB (postmenopausal bleeding)    PONV (postoperative nausea and vomiting)    slower to awkaen after cholecystectomy   Seasonal allergies    Skin irritation    both breats where bra rubs healing well using fluticasone prn per pt   Toenail fungus    left great toe using compounded med for   Wears contact lenses    Wears glasses    Zoster without complications     Past Surgical History:  Procedure Laterality Date   BALLOON DILATION  05/04/2012   Procedure: BALLOON DILATION;  Surgeon: Claudis RAYMOND Rivet, MD;  Location: [facility];  Service: Endoscopy;  Laterality: N/A;   CESAREAN SECTION  1997   CHOLECYSTECTOMY  yrs ago   laparoscopic   COLONOSCOPY WITH ESOPHAGOGASTRODUODENOSCOPY (EGD)  05/04/2012   Procedure: COLONOSCOPY WITH ESOPHAGOGASTRODUODENOSCOPY (EGD);  Surgeon: Claudis RAYMOND Rivet, MD;  Location: [facility];  Service: Endoscopy;  Laterality: N/A;  730   Cyst removed from right  finger  yrs ago   CYSTOSCOPY  08/17/2020   Procedure: CYSTOSCOPY;  Surgeon: Sarrah Browning, MD;  Location: [facility];  Service: Gynecology;;   Expoloratory Lap  1980s   MALONEY DILATION  05/04/2012   Procedure: AGAPITO DILATION;  Surgeon: Claudis RAYMOND Rivet, MD;  Location: [facility];  Service: Endoscopy;  Laterality: N/A;   ROBOTIC ASSISTED LAPAROSCOPIC HYSTERECTOMY AND SALPINGECTOMY Bilateral 08/17/2020   Procedure: XI ROBOTIC ASSISTED LAPAROSCOPIC HYSTERECTOMY AND SALPINGOOOPHORECTOMY;  Surgeon: Sarrah Browning, MD;  Location: [facility];  Service: Gynecology;  Laterality: Bilateral;   SAVORY DILATION  05/04/2012   Procedure: SAVORY DILATION;  Surgeon: Claudis RAYMOND Rivet, MD;  Location: [facility];  Service: Endoscopy;   Laterality: N/A;    Family History  Problem Relation Age of Onset   Cancer Mother        breast   Breast cancer Mother 90   Cancer Father        prostate   Diabetes Sister    Hypertension Sister    Heart disease Maternal Grandfather    Other Paternal Grandfather        cerebral hemorrhage   Colon cancer Neg Hx    Colon polyps Neg Hx    Esophageal cancer Neg Hx    Stomach cancer Neg Hx    Rectal cancer Neg Hx     Social History   Socioeconomic History   Marital status: Married    Spouse name: Not on file   Number of children: Not on file   Years of education: Not on file   Highest education level: Not on file  Occupational History   Not on file  Tobacco Use   Smoking status: Never   Smokeless tobacco: Never  Vaping Use   Vaping status: Never Used  Substance and Sexual Activity   Alcohol use: No    Alcohol/week: 0.0 standard drinks of alcohol   Drug use: No   Sexual activity: Yes    Birth control/protection: None  Other Topics Concern   Not on file  Social History Narrative   Not on file   Social Drivers of Health   Financial Resource Strain: Not on file  Food Insecurity: Not on file  Transportation Needs: Not on file  Physical Activity: Not on file  Stress: Not on file  Social Connections: Not on file  Intimate Partner Violence: Not on file    Outpatient Medications Prior to Visit  Medication Sig Dispense Refill   acetaminophen  (TYLENOL ) 500 MG tablet Take 500 mg by mouth every 6 (six) hours as needed.     albuterol  (VENTOLIN  HFA) 108 (90 Base) MCG/ACT inhaler Inhale 2 puffs into the lungs every 6 (six) hours as needed. 8 g 0   azelastine  (ASTELIN ) 0.1 % nasal spray Place 1 spray into both nostrils 2 (two) times daily. Use in each nostril as directed 30 mL 0   ibuprofen  (ADVIL ) 200 MG tablet Take 200 mg by mouth every 6 (six) hours as needed.     levocetirizine (XYZAL ) 5 MG tablet Take 10 mg by mouth daily. Takes 2 of 10 mg in am per pt     Sodium  Sulfate-Mag Sulfate-KCl (SUTAB ) 1479-225-188 MG TABS Take 12 tablets by mouth as directed. 24 tablet 0   allopurinol  (ZYLOPRIM ) 300 MG tablet Take 1 tablet (300 mg) by mouth daily. 90 tablet 0   azithromycin  (ZITHROMAX ) 250 MG tablet Take 1 tablet (250 mg total) by mouth daily. Take first 2 tablets  together, then 1 every day until finished. 6 tablet 0   promethazine -dextromethorphan (PROMETHAZINE -DM) 6.25-15 MG/5ML syrup Take 5 mLs by mouth 4 (four) times daily as needed. 100 mL 0   telmisartan -hydrochlorothiazide  (MICARDIS  HCT) 80-25 MG tablet Take 1 tablet by mouth daily. 90 tablet 0   HYDROcodone -acetaminophen  (NORCO/VICODIN) 5-325 MG tablet Take 1 tablet by mouth every 6 (six) hours as needed. 10 tablet 0   hydrOXYzine  (VISTARIL ) 25 MG capsule Take 1 capsule (25 mg total) by mouth every 8 (eight) hours as needed. (Patient not taking: Reported on 04/03/2023) 30 capsule 0   ketoconazole  (NIZORAL ) 2 % cream Apply 1 Application topically to affected area daily. 120 g 4   meloxicam  (MOBIC ) 15 MG tablet Take 1 tablet (15 mg total) by mouth daily. (Patient not taking: Reported on 04/03/2023) 30 tablet 0   methylPREDNISolone  (MEDROL  DOSEPAK) 4 MG TBPK tablet Take as directed 21 tablet 0   Vitamin D , Ergocalciferol , (DRISDOL ) 1.25 MG (50000 UNIT) CAPS capsule Take 1 capsule by mouth every 7 (seven) days.     No facility-administered medications prior to visit.    Allergies  Allergen Reactions   Penicillins Rash    Severe rash    ROS Review of Systems  Constitutional:  Positive for fatigue. Negative for chills and fever.  HENT:  Negative for congestion, sinus pressure, sinus pain and sore throat.   Eyes:  Negative for pain and discharge.  Respiratory:  Negative for cough and shortness of breath.   Cardiovascular:  Negative for chest pain and palpitations.  Gastrointestinal:  Negative for abdominal pain, diarrhea, nausea and vomiting.  Endocrine: Negative for polydipsia and polyuria.   Genitourinary:  Negative for dysuria and hematuria.  Musculoskeletal:  Positive for back pain. Negative for neck pain and neck stiffness.  Skin:  Negative for rash.  Neurological:  Negative for dizziness and weakness.  Psychiatric/Behavioral:  Positive for sleep disturbance. Negative for agitation and behavioral problems. The patient is nervous/anxious.       Objective:    Physical Exam Vitals reviewed.  Constitutional:      General: She is not in acute distress.    Appearance: She is obese. She is not diaphoretic.  HENT:     Head: Normocephalic and atraumatic.     Nose: Nose normal. No congestion.     Mouth/Throat:     Mouth: Mucous membranes are moist.     Pharynx: No posterior oropharyngeal erythema.  Eyes:     General: No scleral icterus.    Extraocular Movements: Extraocular movements intact.  Neck:     Vascular: No carotid bruit.  Cardiovascular:     Rate and Rhythm: Normal rate and regular rhythm.     Heart sounds: Normal heart sounds. No murmur heard. Pulmonary:     Breath sounds: Normal breath sounds. No wheezing or rales.  Abdominal:     Palpations: Abdomen is soft.     Tenderness: There is no abdominal tenderness.  Musculoskeletal:     Cervical back: Neck supple. No tenderness.     Right lower leg: No edema.     Left lower leg: No edema.  Skin:    General: Skin is warm.     Findings: No rash.  Neurological:     General: No focal deficit present.     Mental Status: She is alert and oriented to person, place, and time.     Cranial Nerves: No cranial nerve deficit.     Sensory: No sensory deficit.  Motor: No weakness.  Psychiatric:        Mood and Affect: Mood normal.        Behavior: Behavior normal.     BP 120/72 (BP Location: Left Arm)   Pulse 80   Wt 242 lb (109.8 kg)   SpO2 99%   BMI 36.80 kg/m  Wt Readings from Last 3 Encounters:  01/10/24 242 lb (109.8 kg)  04/03/23 231 lb (104.8 kg)  03/03/23 235 lb (106.6 kg)    Lab Results   Component Value Date   TSH 0.959 12/30/2022   Lab Results  Component Value Date   WBC 7.7 12/30/2022   HGB 12.9 12/30/2022   HCT 39.1 12/30/2022   MCV 90 12/30/2022   PLT 258 12/30/2022   Lab Results  Component Value Date   NA 137 12/30/2022   K 3.8 12/30/2022   CO2 20 12/30/2022   GLUCOSE 80 12/30/2022   BUN 25 12/30/2022   CREATININE 1.23 (H) 12/30/2022   BILITOT 0.5 12/30/2022   ALKPHOS 146 (H) 12/30/2022   AST 16 12/30/2022   ALT 11 12/30/2022   PROT 7.1 12/30/2022   ALBUMIN 4.6 12/30/2022   CALCIUM 10.2 12/30/2022   ANIONGAP 9 08/14/2020   EGFR 49 (L) 12/30/2022   Lab Results  Component Value Date   CHOL 171 12/30/2022   Lab Results  Component Value Date   HDL 36 (L) 12/30/2022   Lab Results  Component Value Date   LDLCALC 107 (H) 12/30/2022   Lab Results  Component Value Date   TRIG 158 (H) 12/30/2022   Lab Results  Component Value Date   CHOLHDL 4.8 (H) 12/30/2022   Lab Results  Component Value Date   HGBA1C 5.6 12/30/2022      Assessment & Plan:   Problem List Items Addressed This Visit       Cardiovascular and Mediastinum   Essential (primary) hypertension   BP Readings from Last 1 Encounters:  01/10/24 120/72   Well-controlled with telmisartan -HCTZ 80-25 mg QD Switched to valsartan -HCTZ 160-25 mg QD due to cost concern Counseled for compliance with the medications Advised DASH diet and moderate exercise/walking, at least 150 mins/week      Relevant Medications   valsartan -hydrochlorothiazide  (DIOVAN -HCT) 160-25 MG tablet   Other Relevant Orders   CMP14+EGFR   CBC with Differential/Platelet     Musculoskeletal and Integument   DDD (degenerative disc disease), lumbar   Chronic low back pain Tylenol  or ibuprofen  as needed for mild-to-moderate pain Avoid heavy lifting and frequent bending Simple back exercises advised        Other   Anxiety   Uncontrolled currently Had drowsiness with hydroxyzine  Was on Lexapro  in the  past Switched to Wellbutrin , advised to start with half tablet QD for 1 week and then 150 mg QD Advised to take it regularly instead of as needed      Relevant Medications   buPROPion  (WELLBUTRIN  XL) 150 MG 24 hr tablet   Other Relevant Orders   TSH   Encounter for general adult medical examination with abnormal findings - Primary   Physical exam as documented. Counseling done  re healthy lifestyle involving commitment to 150 minutes exercise per week, heart healthy diet, and attaining healthy weight.The importance of adequate sleep also discussed. Immunization and cancer screening needs are specifically addressed at this visit.      Prediabetes   Lab Results  Component Value Date   HGBA1C 5.6 12/30/2022   Advised to follow low carb diet  Relevant Orders   Hemoglobin A1c   CMP14+EGFR   Gout   Takes allopurinol  300 mg QD Does not report any recent gout flareups      Relevant Medications   allopurinol  (ZYLOPRIM ) 300 MG tablet   Other Visit Diagnoses       Vitamin D  deficiency       Relevant Orders   VITAMIN D  25 Hydroxy (Vit-D Deficiency, Fractures)     Hyperlipidemia, unspecified hyperlipidemia type       Relevant Medications   valsartan -hydrochlorothiazide  (DIOVAN -HCT) 160-25 MG tablet   Other Relevant Orders   Lipid panel     Encounter for immunization       Relevant Orders   Pneumococcal conjugate vaccine 20-valent (Completed)       Meds ordered this encounter  Medications   buPROPion  (WELLBUTRIN  XL) 150 MG 24 hr tablet    Sig: Take 1 tablet (150 mg total) by mouth daily.    Dispense:  30 tablet    Refill:  5   allopurinol  (ZYLOPRIM ) 300 MG tablet    Sig: Take 1 tablet (300 mg) by mouth daily.    Dispense:  90 tablet    Refill:  3   valsartan -hydrochlorothiazide  (DIOVAN -HCT) 160-25 MG tablet    Sig: Take 1 tablet by mouth daily.    Dispense:  90 tablet    Refill:  3    Follow-up: Return in about 4 months (around 05/11/2024) for GAD.     Suzzane MARLA Blanch, MD

## 2024-01-10 NOTE — Assessment & Plan Note (Signed)
 Chronic low back pain Tylenol  or ibuprofen  as needed for mild-to-moderate pain Avoid heavy lifting and frequent bending Simple back exercises advised

## 2024-01-10 NOTE — Assessment & Plan Note (Signed)
 Physical exam as documented. Counseling done  re healthy lifestyle involving commitment to 150 minutes exercise per week, heart healthy diet, and attaining healthy weight.The importance of adequate sleep also discussed. Immunization and cancer screening needs are specifically addressed at this visit.

## 2024-01-10 NOTE — Assessment & Plan Note (Addendum)
 Uncontrolled currently Had drowsiness with hydroxyzine  Was on Lexapro  in the past Switched to Wellbutrin , advised to start with half tablet QD for 1 week and then 150 mg QD Advised to take it regularly instead of as needed

## 2024-01-10 NOTE — Assessment & Plan Note (Signed)
 Takes allopurinol  300 mg QD Does not report any recent gout flareups

## 2024-01-12 DIAGNOSIS — I10 Essential (primary) hypertension: Secondary | ICD-10-CM | POA: Diagnosis not present

## 2024-01-12 DIAGNOSIS — E559 Vitamin D deficiency, unspecified: Secondary | ICD-10-CM | POA: Diagnosis not present

## 2024-01-12 DIAGNOSIS — F419 Anxiety disorder, unspecified: Secondary | ICD-10-CM | POA: Diagnosis not present

## 2024-01-12 DIAGNOSIS — R7303 Prediabetes: Secondary | ICD-10-CM | POA: Diagnosis not present

## 2024-01-12 DIAGNOSIS — E785 Hyperlipidemia, unspecified: Secondary | ICD-10-CM | POA: Diagnosis not present

## 2024-01-13 LAB — HEMOGLOBIN A1C
Est. average glucose Bld gHb Est-mCnc: 114 mg/dL
Hgb A1c MFr Bld: 5.6 % (ref 4.8–5.6)

## 2024-01-13 LAB — CBC WITH DIFFERENTIAL/PLATELET
Basophils Absolute: 0.1 x10E3/uL (ref 0.0–0.2)
Basos: 1 %
EOS (ABSOLUTE): 0.2 x10E3/uL (ref 0.0–0.4)
Eos: 3 %
Hematocrit: 40.7 % (ref 34.0–46.6)
Hemoglobin: 13.1 g/dL (ref 11.1–15.9)
Immature Grans (Abs): 0 x10E3/uL (ref 0.0–0.1)
Immature Granulocytes: 0 %
Lymphocytes Absolute: 2.2 x10E3/uL (ref 0.7–3.1)
Lymphs: 30 %
MCH: 29.8 pg (ref 26.6–33.0)
MCHC: 32.2 g/dL (ref 31.5–35.7)
MCV: 93 fL (ref 79–97)
Monocytes Absolute: 0.5 x10E3/uL (ref 0.1–0.9)
Monocytes: 7 %
Neutrophils Absolute: 4.4 x10E3/uL (ref 1.4–7.0)
Neutrophils: 59 %
Platelets: 243 x10E3/uL (ref 150–450)
RBC: 4.4 x10E6/uL (ref 3.77–5.28)
RDW: 13.2 % (ref 11.7–15.4)
WBC: 7.4 x10E3/uL (ref 3.4–10.8)

## 2024-01-13 LAB — CMP14+EGFR
ALT: 13 IU/L (ref 0–32)
AST: 13 IU/L (ref 0–40)
Albumin: 4.4 g/dL (ref 3.9–4.9)
Alkaline Phosphatase: 142 IU/L — ABNORMAL HIGH (ref 44–121)
BUN/Creatinine Ratio: 18 (ref 12–28)
BUN: 17 mg/dL (ref 8–27)
Bilirubin Total: 0.4 mg/dL (ref 0.0–1.2)
CO2: 20 mmol/L (ref 20–29)
Calcium: 9.9 mg/dL (ref 8.7–10.3)
Chloride: 101 mmol/L (ref 96–106)
Creatinine, Ser: 0.97 mg/dL (ref 0.57–1.00)
Globulin, Total: 2.7 g/dL (ref 1.5–4.5)
Glucose: 104 mg/dL — ABNORMAL HIGH (ref 70–99)
Potassium: 3.8 mmol/L (ref 3.5–5.2)
Sodium: 139 mmol/L (ref 134–144)
Total Protein: 7.1 g/dL (ref 6.0–8.5)
eGFR: 65 mL/min/1.73 (ref >59)

## 2024-01-13 LAB — LIPID PANEL
Chol/HDL Ratio: 4.6 ratio — ABNORMAL HIGH (ref 0.0–4.4)
Cholesterol, Total: 189 mg/dL (ref 100–199)
HDL: 41 mg/dL (ref >39)
LDL Chol Calc (NIH): 116 mg/dL — ABNORMAL HIGH (ref 0–99)
Triglycerides: 179 mg/dL — ABNORMAL HIGH (ref 0–149)
VLDL Cholesterol Cal: 32 mg/dL (ref 5–40)

## 2024-01-13 LAB — VITAMIN D 25 HYDROXY (VIT D DEFICIENCY, FRACTURES): Vit D, 25-Hydroxy: 57.4 ng/mL (ref 30.0–100.0)

## 2024-01-13 LAB — TSH: TSH: 1.25 u[IU]/mL (ref 0.450–4.500)

## 2024-01-15 ENCOUNTER — Ambulatory Visit: Payer: Self-pay | Admitting: Internal Medicine

## 2024-01-28 ENCOUNTER — Ambulatory Visit
Admission: EM | Admit: 2024-01-28 | Discharge: 2024-01-28 | Disposition: A | Discharge status: Home / Self Care | Attending: Nurse Practitioner | Admitting: Nurse Practitioner

## 2024-01-28 DIAGNOSIS — U071 COVID-19: Secondary | ICD-10-CM

## 2024-01-28 DIAGNOSIS — R059 Cough, unspecified: Secondary | ICD-10-CM

## 2024-01-28 LAB — POC SOFIA SARS ANTIGEN FIA: SARS Coronavirus 2 Ag: POSITIVE — AB

## 2024-01-28 MED ORDER — PAXLOVID (300/100) 20 X 150 MG & 10 X 100MG PO TBPK: 3.0000 | ORAL_TABLET | Freq: Two times a day (BID) | ORAL | 0 refills | Status: AC

## 2024-01-28 NOTE — Discharge Instructions (Addendum)
 The COVID test was positive. Take medication as prescribed.  You may continue Astelin  nasal spray and Promethazine  DM for your cough. Increase fluids and allow for plenty of rest. You may take over-the-counter Tylenol  as needed for pain, fever, or general discomfort. Recommend normal saline nasal spray throughout the day for nasal congestion and runny nose. For your cough, recommend use of a humidifier in your bedroom at nighttime during sleep and sleeping elevated on pillows while symptoms persist. If you develop fever, you do need to remain home until you have been fever free for 24 hours with no medication. Go to the emergency department if you experience shortness of breath, difficulty breathing, or other concerns. Follow-up as needed.

## 2024-01-28 NOTE — ED Triage Notes (Signed)
 Pt reports sinus pressure, drainage, ear pressure, and cough occasionally x 2 days    Took asteline nasal spray, Prometh  cough syrup, ibuprofen , allergy meds which gave some relief.

## 2024-01-28 NOTE — ED Provider Notes (Signed)
 [facility]    CSN: [id number] Arrival date & time: 01/28/24  1405      History   Chief Complaint No chief complaint on file.   HPI Renee Alvarez is a 63 y.o. female.   The history is provided by the patient.   Patient presents with a 2-day history of sinus pressure, drainage, ear pressure, and cough.  Patient denies fever, chills, headache, ear drainage, wheezing, difficulty breathing, chest pain, abdominal pain, nausea, vomiting, diarrhea, or rash.  States that she has taken Astelin  nasal spray, Promethazine  DM, ibuprofen , and allergy medications for her symptoms with some relief.  Past Medical History:  Diagnosis Date   Allergic rhinitis due to pollen    Anxiety    Arthritis    oa lumbar region   Breast cyst, right 05/23/2019   Chronic back pain    Essential (primary) hypertension    GERD (gastroesophageal reflux disease)    Gout last flare up jan 2022   Headache    Heart murmur    told years ago had mild murmur none heard recently per pt   History of hiatal hernia    Hypertension    IBS (irritable bowel syndrome)    ibsd per pt   Low back pain    Obesity, unspecified    PMB (postmenopausal bleeding)    PONV (postoperative nausea and vomiting)    slower to awkaen after cholecystectomy   Seasonal allergies    Skin irritation    both breats where bra rubs healing well using fluticasone prn per pt   Toenail fungus    left great toe using compounded med for   Wears contact lenses    Wears glasses    Zoster without complications     Patient Active Problem List   Diagnosis Date Noted   Gout 01/10/2024   Prediabetes 12/30/2022   Need for Tdap vaccination 12/30/2022   Encounter for general adult medical examination with abnormal findings 04/01/2022   Anxiety 09/07/2021   DDD (degenerative disc disease), lumbar 09/07/2021   Primary osteoarthritis involving multiple joints 09/07/2021   Obesity (BMI 30-39.9) 10/08/2019   Postmenopausal bleeding  10/08/2019   Gastroesophageal reflux disease without esophagitis 05/23/2019   Allergic rhinitis due to pollen    Essential (primary) hypertension     Past Surgical History:  Procedure Laterality Date   BALLOON DILATION  05/04/2012   Procedure: BALLOON DILATION;  Surgeon: Claudis RAYMOND Rivet, MD;  Location: [facility];  Service: Endoscopy;  Laterality: N/A;   CESAREAN SECTION  1997   CHOLECYSTECTOMY  yrs ago   laparoscopic   COLONOSCOPY WITH ESOPHAGOGASTRODUODENOSCOPY (EGD)  05/04/2012   Procedure: COLONOSCOPY WITH ESOPHAGOGASTRODUODENOSCOPY (EGD);  Surgeon: Claudis RAYMOND Rivet, MD;  Location: [facility];  Service: Endoscopy;  Laterality: N/A;  730   Cyst removed from right finger  yrs ago   CYSTOSCOPY  08/17/2020   Procedure: CYSTOSCOPY;  Surgeon: Sarrah Browning, MD;  Location: [facility];  Service: Gynecology;;   Expoloratory Lap  1980s   MALONEY DILATION  05/04/2012   Procedure: AGAPITO DILATION;  Surgeon: Claudis RAYMOND Rivet, MD;  Location: [facility];  Service: Endoscopy;  Laterality: N/A;   ROBOTIC ASSISTED LAPAROSCOPIC HYSTERECTOMY AND SALPINGECTOMY Bilateral 08/17/2020   Procedure: XI ROBOTIC ASSISTED LAPAROSCOPIC HYSTERECTOMY AND SALPINGOOOPHORECTOMY;  Surgeon: Sarrah Browning, MD;  Location: [facility];  Service: Gynecology;  Laterality: Bilateral;   SAVORY DILATION  05/04/2012   Procedure: SAVORY DILATION;  Surgeon: Claudis RAYMOND Rivet,  MD;  Location: [facility];  Service: Endoscopy;  Laterality: N/A;    OB History     Gravida  2   Para  2   Term      Preterm      AB      Living  2      SAB      IAB      Ectopic      Multiple      Live Births               Home Medications    Prior to Admission medications   Medication Sig Start Date End Date Taking? Authorizing Provider  acetaminophen  (TYLENOL ) 500 MG tablet Take 500 mg by mouth every 6 (six) hours as needed.    [provider]  albuterol  (VENTOLIN  HFA)  108 (90 Base) MCG/ACT inhaler Inhale 2 puffs into the lungs every 6 (six) hours as needed. 09/27/23   Leath-Warren, Etta PARAS, NP  allopurinol  (ZYLOPRIM ) 300 MG tablet Take 1 tablet (300 mg) by mouth daily. 01/10/24   Tobie Suzzane POUR, MD  azelastine  (ASTELIN ) 0.1 % nasal spray Place 1 spray into both nostrils 2 (two) times daily. Use in each nostril as directed 09/17/23   Stuart Vernell Norris, PA-C  buPROPion  (WELLBUTRIN  XL) 150 MG 24 hr tablet Take 1 tablet (150 mg total) by mouth daily. 01/10/24   Tobie Suzzane POUR, MD  ibuprofen  (ADVIL ) 200 MG tablet Take 200 mg by mouth every 6 (six) hours as needed.    [provider]  levocetirizine (XYZAL ) 5 MG tablet Take 10 mg by mouth daily. Takes 2 of 10 mg in am per pt    [provider]  Sodium Sulfate-Mag Sulfate-KCl (SUTAB ) 1479-225-188 MG TABS Take 12 tablets by mouth as directed. 04/03/23   Charlanne Groom, MD  valsartan -hydrochlorothiazide  (DIOVAN -HCT) 160-25 MG tablet Take 1 tablet by mouth daily. 01/10/24   Tobie Suzzane POUR, MD  progesterone  (PROMETRIUM ) 200 MG capsule Take 200 mg by mouth at bedtime. 05/21/20 09/01/20  [provider]    Family History Family History  Problem Relation Age of Onset   Cancer Mother        breast   Breast cancer Mother 86   Cancer Father        prostate   Diabetes Sister    Hypertension Sister    Heart disease Maternal Grandfather    Other Paternal Grandfather        cerebral hemorrhage   Colon cancer Neg Hx    Colon polyps Neg Hx    Esophageal cancer Neg Hx    Stomach cancer Neg Hx    Rectal cancer Neg Hx     Social History Social History   Tobacco Use   Smoking status: Never   Smokeless tobacco: Never  Vaping Use   Vaping status: Never Used  Substance Use Topics   Alcohol use: No    Alcohol/week: 0.0 standard drinks of alcohol   Drug use: No     Allergies   Penicillins   Review of Systems Review of Systems Per HPI  Physical Exam Triage Vital Signs ED Triage  Vitals  Encounter Vitals Group     BP 01/28/24 1457 124/73     Girls Systolic BP Percentile --      Girls Diastolic BP Percentile --      Boys Systolic BP Percentile --      Boys Diastolic BP Percentile --  Pulse Rate 01/28/24 1457 81     Resp 01/28/24 1457 18     Temp 01/28/24 1457 97.8 F (36.6 C)     Temp Source 01/28/24 1457 Oral     SpO2 01/28/24 1457 93 %     Weight --      Height --      Head Circumference --      Peak Flow --      Pain Score 01/28/24 1424 0     Pain Loc --      Pain Education --      Exclude from Growth Chart --    No data found.  Updated Vital Signs BP 124/73 (BP Location: Right Arm)   Pulse 81   Temp 97.8 F (36.6 C) (Oral)   Resp 18   SpO2 93%   Visual Acuity Right Eye Distance:   Left Eye Distance:   Bilateral Distance:    Right Eye Near:   Left Eye Near:    Bilateral Near:     Physical Exam Vitals and nursing note reviewed.  Constitutional:      General: She is not in acute distress.    Appearance: Normal appearance.  HENT:     Head: Normocephalic.     Right Ear: Tympanic membrane, ear canal and external ear normal.     Left Ear: Tympanic membrane, ear canal and external ear normal.     Nose: Congestion present.     Mouth/Throat:     Lips: Pink.     Mouth: Mucous membranes are moist.     Pharynx: Postnasal drip present. No pharyngeal swelling, oropharyngeal exudate, posterior oropharyngeal erythema or uvula swelling.     Comments: Cobblestoning present to posterior oropharynx  Eyes:     Extraocular Movements: Extraocular movements intact.     Pupils: Pupils are equal, round, and reactive to light.  Cardiovascular:     Rate and Rhythm: Normal rate and regular rhythm.     Pulses: Normal pulses.     Heart sounds: Normal heart sounds.  Pulmonary:     Effort: Pulmonary effort is normal. No respiratory distress.     Breath sounds: Normal breath sounds. No stridor. No wheezing, rhonchi or rales.  Abdominal:     General:  Bowel sounds are normal.     Palpations: Abdomen is soft.  Musculoskeletal:     Cervical back: Normal range of motion.  Skin:    General: Skin is warm and dry.  Neurological:     General: No focal deficit present.     Mental Status: She is alert and oriented to person, place, and time.  Psychiatric:        Mood and Affect: Mood normal.        Behavior: Behavior normal.      UC Treatments / Results  Labs (all labs ordered are listed, but only abnormal results are displayed) Labs Reviewed  POC SOFIA SARS ANTIGEN FIA - Abnormal; Notable for the following components:      Result Value   SARS Coronavirus 2 Ag Positive (*)    All other components within normal limits    EKG   Radiology No results found.  Procedures Procedures (including critical care time)  Medications Ordered in UC Medications - No data to display  Initial Impression / Assessment and Plan / UC Course  I have reviewed the triage vital signs and the nursing notes.  Pertinent labs & imaging results that were available during my care of the  patient were reviewed by me and considered in my medical decision making (see chart for details).  COVID test was positive.  Will treat with Paxlovid .  Supportive care recommendations were provided and discussed with the patient to include fluids, rest, over-the-counter analgesics, use of normal saline nasal spray, and use of a humidifier during sleep.  Discussed indications with patient regarding follow-up.  Patient was also given strict ER follow-up precautions.  Patient was in agreement with this plan of care and verbalizes understanding.  All questions were answered.  Patient stable for discharge.   Final Clinical Impressions(s) / UC Diagnoses   Final diagnoses:  Cough, unspecified type   Discharge Instructions   None    ED Prescriptions   None    PDMP not reviewed this encounter.   Gilmer Etta PARAS, NP 01/28/24 [phone]

## 2024-01-29 ENCOUNTER — Other Ambulatory Visit (HOSPITAL_COMMUNITY): Payer: Self-pay

## 2024-04-05 ENCOUNTER — Other Ambulatory Visit (HOSPITAL_BASED_OUTPATIENT_CLINIC_OR_DEPARTMENT_OTHER): Payer: Self-pay

## 2024-05-08 ENCOUNTER — Other Ambulatory Visit (HOSPITAL_COMMUNITY): Payer: Self-pay

## 2024-05-10 ENCOUNTER — Other Ambulatory Visit (HOSPITAL_COMMUNITY): Payer: Self-pay

## 2024-05-11 ENCOUNTER — Other Ambulatory Visit (HOSPITAL_COMMUNITY): Payer: Self-pay

## 2024-05-11 MED ORDER — BUPROPION HCL ER (XL) 150 MG PO TB24
150.0000 mg | ORAL_TABLET | Freq: Every day | ORAL | 5 refills | Status: AC
  Filled 2024-05-11: qty 30, 30d supply, fill #0
  Filled 2024-06-17: qty 90, 90d supply, fill #1

## 2024-05-13 ENCOUNTER — Ambulatory Visit: Admitting: Internal Medicine

## 2024-05-13 ENCOUNTER — Other Ambulatory Visit (HOSPITAL_COMMUNITY): Payer: Self-pay

## 2024-05-14 ENCOUNTER — Other Ambulatory Visit (HOSPITAL_COMMUNITY): Payer: Self-pay

## 2024-05-17 ENCOUNTER — Other Ambulatory Visit: Payer: Self-pay

## 2024-05-17 ENCOUNTER — Other Ambulatory Visit (HOSPITAL_COMMUNITY): Payer: Self-pay

## 2024-06-17 ENCOUNTER — Other Ambulatory Visit (HOSPITAL_COMMUNITY): Payer: Self-pay

## 2024-06-18 ENCOUNTER — Other Ambulatory Visit (HOSPITAL_COMMUNITY): Payer: Self-pay

## 2024-06-18 ENCOUNTER — Other Ambulatory Visit: Payer: Self-pay

## 2024-07-25 ENCOUNTER — Ambulatory Visit: Payer: Self-pay | Admitting: Internal Medicine
